# Patient Record
Sex: Female | Born: 1980 | Race: Black or African American | Hispanic: No | Marital: Married | Smoking: Never smoker
Health system: Southern US, Community
[De-identification: ages and names within clinical notes are randomized; demographics above are authoritative.]

## PROBLEM LIST (undated history)

## (undated) ENCOUNTER — Ambulatory Visit: Discharge status: Absent without leave | Payer: BC Managed Care – PPO

## (undated) DIAGNOSIS — Z87440 Personal history of urinary (tract) infections: Secondary | ICD-10-CM

## (undated) DIAGNOSIS — R519 Headache, unspecified: Secondary | ICD-10-CM

## (undated) HISTORY — DX: Headache, unspecified: R51.9

## (undated) HISTORY — DX: Personal history of urinary (tract) infections: Z87.440

## (undated) HISTORY — PX: DILATION AND CURETTAGE, DIAGNOSTIC / THERAPEUTIC: SUR384

## (undated) HISTORY — PX: TUBAL LIGATION: SHX77

---

## 2001-02-26 ENCOUNTER — Encounter: Payer: Self-pay | Admitting: Obstetrics and Gynecology

## 2001-02-26 ENCOUNTER — Ambulatory Visit (HOSPITAL_COMMUNITY): Admission: RE | Admit: 2001-02-26 | Discharge: 2001-02-26 | Payer: Self-pay | Admitting: Obstetrics and Gynecology

## 2001-06-07 ENCOUNTER — Encounter: Payer: Self-pay | Admitting: Obstetrics and Gynecology

## 2001-06-07 ENCOUNTER — Ambulatory Visit (HOSPITAL_COMMUNITY): Admission: RE | Admit: 2001-06-07 | Discharge: 2001-06-07 | Payer: Self-pay | Admitting: Obstetrics and Gynecology

## 2001-07-12 ENCOUNTER — Inpatient Hospital Stay (HOSPITAL_COMMUNITY): Admission: AD | Admit: 2001-07-12 | Discharge: 2001-07-16 | Payer: Self-pay | Admitting: Obstetrics and Gynecology

## 2002-04-25 ENCOUNTER — Other Ambulatory Visit: Admission: RE | Admit: 2002-04-25 | Discharge: 2002-04-25 | Payer: Self-pay | Admitting: Family Medicine

## 2003-08-15 ENCOUNTER — Other Ambulatory Visit: Admission: RE | Admit: 2003-08-15 | Discharge: 2003-08-15 | Payer: Self-pay | Admitting: Obstetrics and Gynecology

## 2004-01-03 ENCOUNTER — Inpatient Hospital Stay (HOSPITAL_COMMUNITY): Admission: AD | Admit: 2004-01-03 | Discharge: 2004-01-03 | Payer: Self-pay | Admitting: Obstetrics and Gynecology

## 2004-02-07 ENCOUNTER — Inpatient Hospital Stay (HOSPITAL_COMMUNITY): Admission: RE | Admit: 2004-02-07 | Discharge: 2004-02-11 | Payer: Self-pay | Admitting: Obstetrics and Gynecology

## 2004-03-19 ENCOUNTER — Other Ambulatory Visit: Admission: RE | Admit: 2004-03-19 | Discharge: 2004-03-19 | Payer: Self-pay | Admitting: Obstetrics and Gynecology

## 2004-10-25 ENCOUNTER — Ambulatory Visit: Payer: Self-pay | Admitting: Family Medicine

## 2005-05-12 ENCOUNTER — Ambulatory Visit: Payer: Self-pay | Admitting: Family Medicine

## 2005-05-15 ENCOUNTER — Other Ambulatory Visit: Admission: RE | Admit: 2005-05-15 | Discharge: 2005-05-15 | Payer: Self-pay | Admitting: Family Medicine

## 2005-05-15 ENCOUNTER — Ambulatory Visit: Payer: Self-pay | Admitting: Family Medicine

## 2005-06-06 ENCOUNTER — Ambulatory Visit: Payer: Self-pay | Admitting: Family Medicine

## 2005-08-15 ENCOUNTER — Ambulatory Visit: Payer: Self-pay | Admitting: Family Medicine

## 2005-08-27 ENCOUNTER — Ambulatory Visit: Payer: Self-pay | Admitting: Family Medicine

## 2005-09-30 ENCOUNTER — Ambulatory Visit: Payer: Self-pay | Admitting: Family Medicine

## 2006-01-27 ENCOUNTER — Ambulatory Visit: Payer: Self-pay | Admitting: Family Medicine

## 2006-05-18 ENCOUNTER — Encounter: Payer: Self-pay | Admitting: Family Medicine

## 2006-05-18 ENCOUNTER — Ambulatory Visit: Payer: Self-pay | Admitting: Family Medicine

## 2006-05-18 ENCOUNTER — Other Ambulatory Visit: Admission: RE | Admit: 2006-05-18 | Discharge: 2006-05-18 | Payer: Self-pay | Admitting: Family Medicine

## 2007-02-09 ENCOUNTER — Ambulatory Visit: Payer: Self-pay | Admitting: Family Medicine

## 2007-07-05 ENCOUNTER — Ambulatory Visit: Payer: Self-pay | Admitting: Family Medicine

## 2007-07-05 DIAGNOSIS — R079 Chest pain, unspecified: Secondary | ICD-10-CM | POA: Insufficient documentation

## 2007-07-05 DIAGNOSIS — R4589 Other symptoms and signs involving emotional state: Secondary | ICD-10-CM

## 2007-07-05 DIAGNOSIS — K219 Gastro-esophageal reflux disease without esophagitis: Secondary | ICD-10-CM | POA: Insufficient documentation

## 2007-07-08 ENCOUNTER — Telehealth: Payer: Self-pay | Admitting: Family Medicine

## 2007-07-13 ENCOUNTER — Ambulatory Visit: Payer: Self-pay | Admitting: Family Medicine

## 2007-07-26 ENCOUNTER — Ambulatory Visit (HOSPITAL_COMMUNITY): Admission: RE | Admit: 2007-07-26 | Discharge: 2007-07-26 | Payer: Self-pay | Admitting: Gynecology

## 2007-08-03 ENCOUNTER — Ambulatory Visit: Payer: Self-pay | Admitting: Family Medicine

## 2007-08-03 ENCOUNTER — Encounter: Payer: Self-pay | Admitting: Obstetrics & Gynecology

## 2007-08-31 ENCOUNTER — Ambulatory Visit: Payer: Self-pay | Admitting: Family Medicine

## 2007-09-21 ENCOUNTER — Ambulatory Visit: Payer: Self-pay | Admitting: Family Medicine

## 2007-09-28 ENCOUNTER — Ambulatory Visit (HOSPITAL_COMMUNITY): Admission: RE | Admit: 2007-09-28 | Discharge: 2007-09-28 | Payer: Self-pay | Admitting: Gynecology

## 2007-09-28 ENCOUNTER — Ambulatory Visit: Payer: Self-pay | Admitting: Obstetrics & Gynecology

## 2007-10-12 ENCOUNTER — Ambulatory Visit (HOSPITAL_COMMUNITY): Admission: RE | Admit: 2007-10-12 | Discharge: 2007-10-12 | Payer: Self-pay | Admitting: Gynecology

## 2007-10-12 ENCOUNTER — Ambulatory Visit: Payer: Self-pay | Admitting: Family Medicine

## 2007-11-08 ENCOUNTER — Ambulatory Visit: Payer: Self-pay | Admitting: Gynecology

## 2007-12-01 ENCOUNTER — Ambulatory Visit: Payer: Self-pay | Admitting: Obstetrics & Gynecology

## 2007-12-21 ENCOUNTER — Ambulatory Visit: Payer: Self-pay | Admitting: Family Medicine

## 2008-01-04 ENCOUNTER — Ambulatory Visit: Payer: Self-pay | Admitting: Gynecology

## 2008-01-17 ENCOUNTER — Ambulatory Visit: Payer: Self-pay | Admitting: Gynecology

## 2008-01-31 ENCOUNTER — Ambulatory Visit: Payer: Self-pay | Admitting: Family Medicine

## 2008-02-08 ENCOUNTER — Ambulatory Visit: Payer: Self-pay | Admitting: Family Medicine

## 2008-02-15 ENCOUNTER — Ambulatory Visit: Payer: Self-pay | Admitting: Family Medicine

## 2008-02-24 ENCOUNTER — Encounter: Payer: Self-pay | Admitting: Obstetrics and Gynecology

## 2008-02-24 ENCOUNTER — Inpatient Hospital Stay (HOSPITAL_COMMUNITY): Admission: RE | Admit: 2008-02-24 | Discharge: 2008-02-27 | Payer: Self-pay | Admitting: Obstetrics and Gynecology

## 2008-02-24 ENCOUNTER — Ambulatory Visit: Payer: Self-pay | Admitting: Obstetrics and Gynecology

## 2008-04-04 ENCOUNTER — Ambulatory Visit: Payer: Self-pay | Admitting: Family Medicine

## 2009-04-02 ENCOUNTER — Ambulatory Visit: Payer: Self-pay | Admitting: Family Medicine

## 2009-04-02 ENCOUNTER — Ambulatory Visit: Payer: Self-pay | Admitting: Obstetrics & Gynecology

## 2009-04-04 ENCOUNTER — Ambulatory Visit (HOSPITAL_COMMUNITY): Admission: RE | Admit: 2009-04-04 | Discharge: 2009-04-04 | Payer: Self-pay | Admitting: Obstetrics & Gynecology

## 2009-04-11 ENCOUNTER — Ambulatory Visit: Payer: Self-pay | Admitting: Family Medicine

## 2009-04-11 ENCOUNTER — Encounter: Payer: Self-pay | Admitting: Family Medicine

## 2009-04-11 LAB — CONVERTED CEMR LAB
Basophils Relative: 0 % (ref 0–1)
Eosinophils Absolute: 0.3 10*3/uL (ref 0.0–0.7)
Eosinophils Relative: 2 % (ref 0–5)
HCT: 40 % (ref 36.0–46.0)
Lymphocytes Relative: 22 % (ref 12–46)
Lymphs Abs: 2.3 10*3/uL (ref 0.7–4.0)
MCHC: 33.5 g/dL (ref 30.0–36.0)
MCV: 79.5 fL (ref 78.0–100.0)
Monocytes Absolute: 0.4 10*3/uL (ref 0.1–1.0)
Monocytes Relative: 4 % (ref 3–12)
Neutro Abs: 7.4 10*3/uL (ref 1.7–7.7)
Neutrophils Relative %: 71 % (ref 43–77)

## 2009-04-17 HISTORY — PX: DILATION AND CURETTAGE, DIAGNOSTIC / THERAPEUTIC: SUR384

## 2009-05-02 ENCOUNTER — Ambulatory Visit (HOSPITAL_COMMUNITY): Admission: RE | Admit: 2009-05-02 | Discharge: 2009-05-02 | Payer: Self-pay | Admitting: Obstetrics and Gynecology

## 2009-05-02 ENCOUNTER — Ambulatory Visit: Payer: Self-pay | Admitting: Obstetrics and Gynecology

## 2009-05-07 ENCOUNTER — Ambulatory Visit (HOSPITAL_COMMUNITY): Admission: RE | Admit: 2009-05-07 | Discharge: 2009-05-07 | Payer: Self-pay | Admitting: Obstetrics & Gynecology

## 2009-05-07 ENCOUNTER — Ambulatory Visit: Payer: Self-pay | Admitting: Obstetrics & Gynecology

## 2009-05-07 ENCOUNTER — Encounter: Payer: Self-pay | Admitting: Obstetrics & Gynecology

## 2009-06-12 ENCOUNTER — Ambulatory Visit: Payer: Self-pay | Admitting: Nurse Practitioner

## 2009-06-12 ENCOUNTER — Encounter: Payer: Self-pay | Admitting: Family Medicine

## 2009-06-12 LAB — CONVERTED CEMR LAB
HCT: 39.2 % (ref 36.0–46.0)
Hemoglobin: 12.8 g/dL (ref 12.0–15.0)
MCHC: 32.7 g/dL (ref 30.0–36.0)
MCV: 78.7 fL (ref 78.0–100.0)
Platelets: 263 10*3/uL (ref 150–400)
RBC: 4.98 M/uL (ref 3.87–5.11)
WBC: 9.8 10*3/uL (ref 4.0–10.5)
hCG, Beta Chain, Quant, S: 47.4 milliintl units/mL

## 2009-06-15 ENCOUNTER — Ambulatory Visit (HOSPITAL_COMMUNITY): Admission: RE | Admit: 2009-06-15 | Discharge: 2009-06-15 | Payer: Self-pay | Admitting: Obstetrics & Gynecology

## 2009-06-17 HISTORY — PX: DILATION AND CURETTAGE, DIAGNOSTIC / THERAPEUTIC: SUR384

## 2009-06-18 ENCOUNTER — Ambulatory Visit: Payer: Self-pay | Admitting: Obstetrics & Gynecology

## 2009-06-18 ENCOUNTER — Encounter: Payer: Self-pay | Admitting: Obstetrics & Gynecology

## 2009-06-18 ENCOUNTER — Ambulatory Visit (HOSPITAL_COMMUNITY): Admission: RE | Admit: 2009-06-18 | Discharge: 2009-06-18 | Payer: Self-pay | Admitting: Obstetrics & Gynecology

## 2010-02-10 IMAGING — US US OB TRANSVAGINAL
1 series · 14 of 24 positions shown · non-contrast
Comparison: none

OBSTETRICAL ULTRASOUND:
 This ultrasound exam was performed in the [HOSPITAL] Ultrasound Department.  The OB US report was generated in the AS system, and faxed to the ordering physician.  This report is also available in [REDACTED] PACS.

[Series 1: us ob transvaginal · 0.15mm/px · 24 acquisitions, 14 frames shown]
[im 1/24]
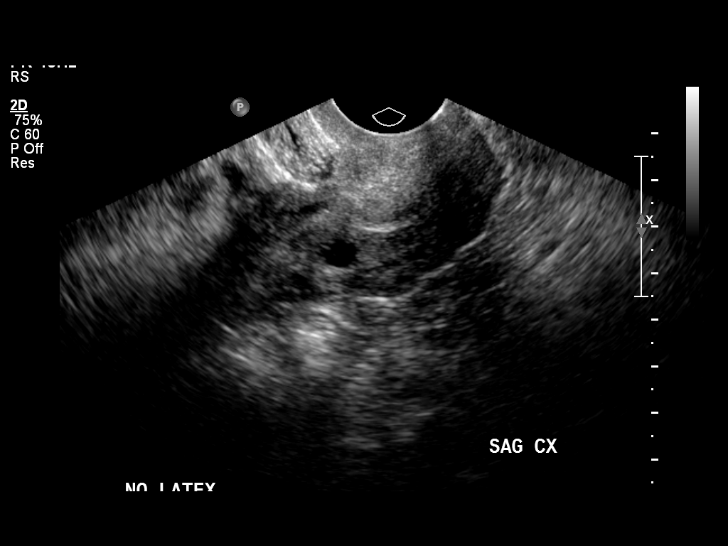
[im 3/24]
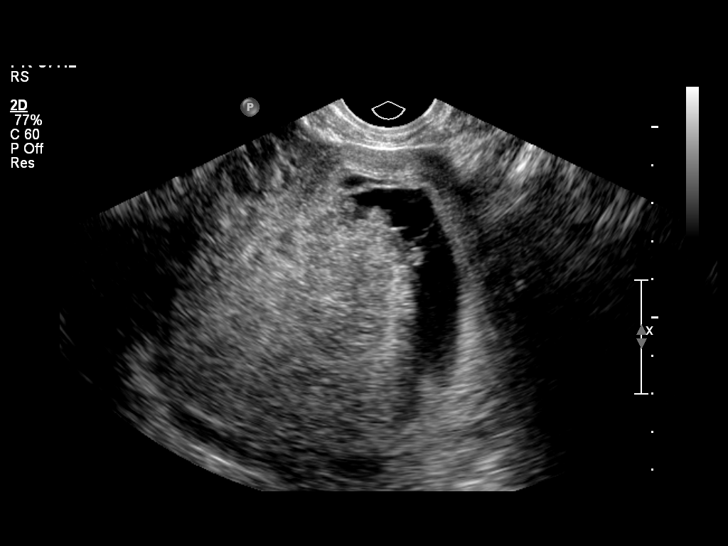
[im 5/24]
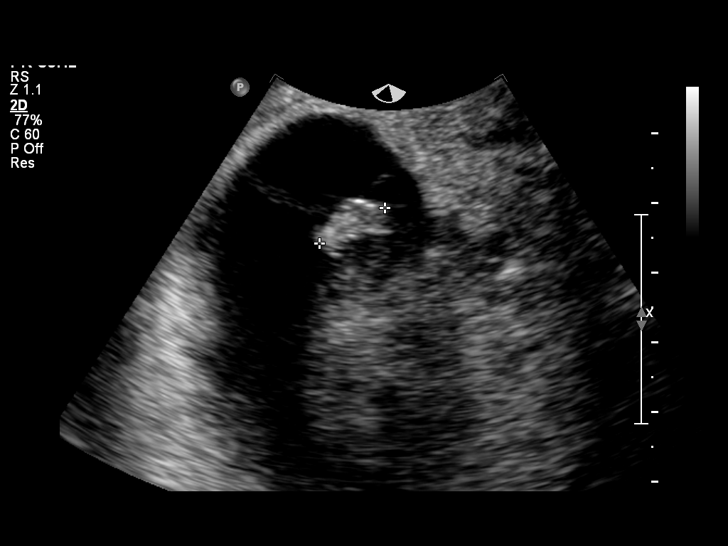
[im 7/24]
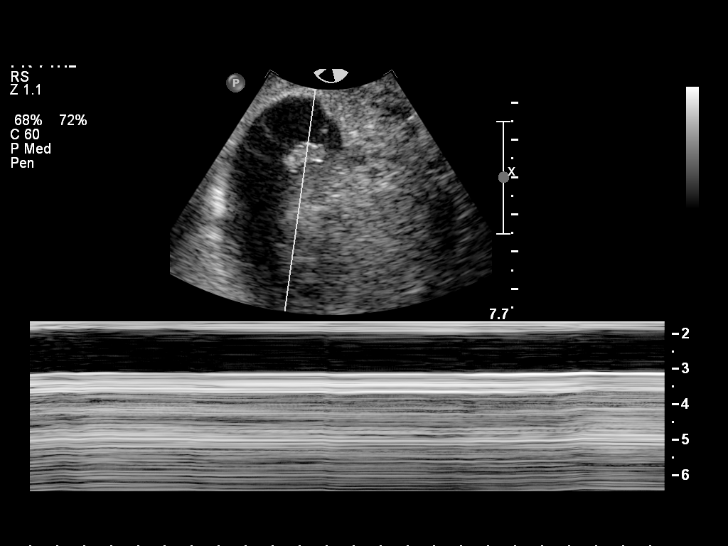
[im 8/24]
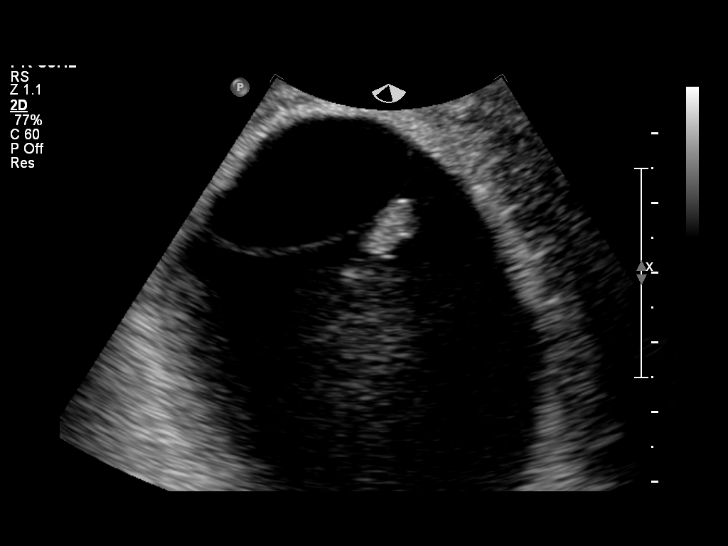
[im 10/24]
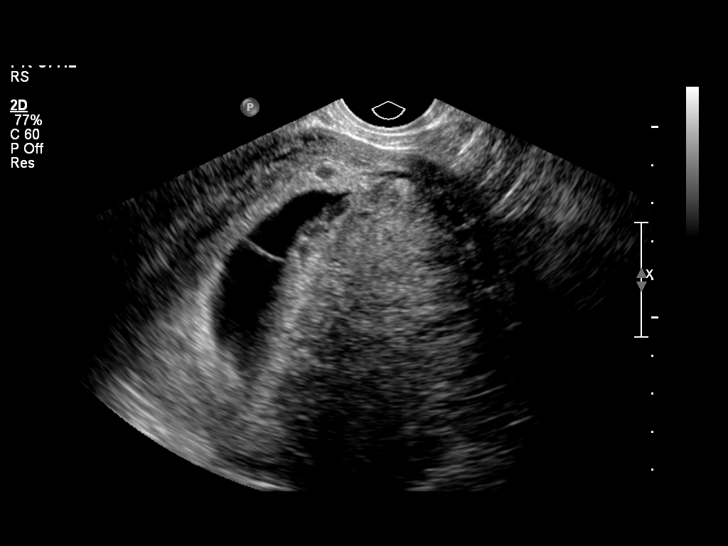
[im 12/24]
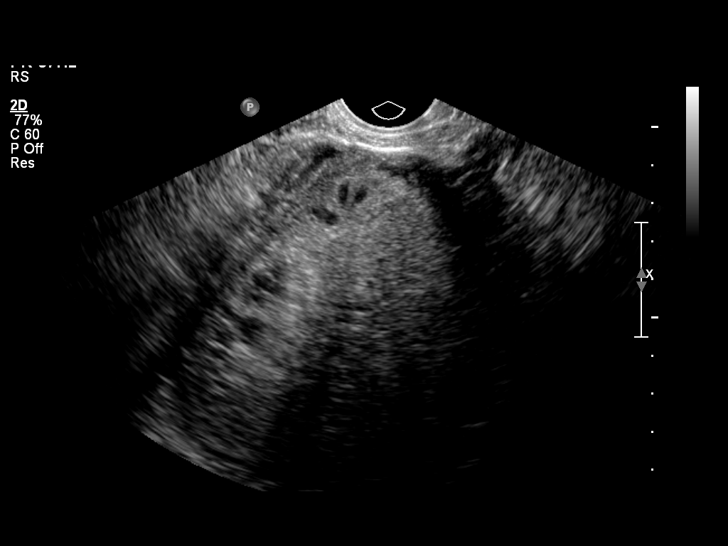
[im 13/24]
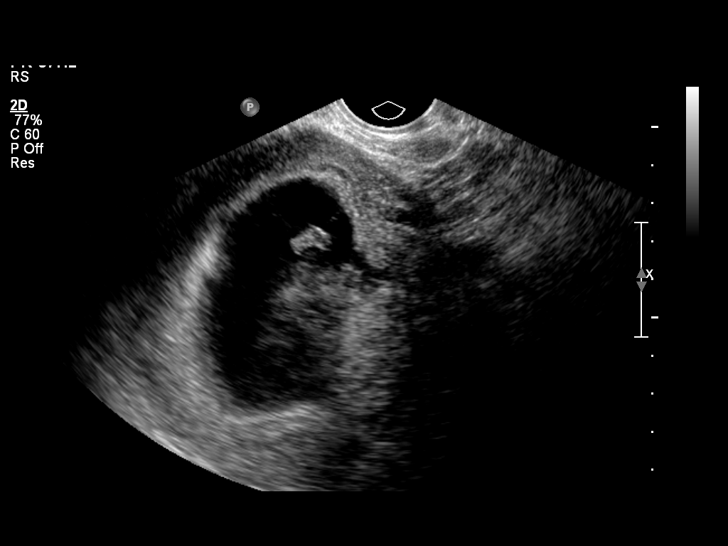
[im 15/24]
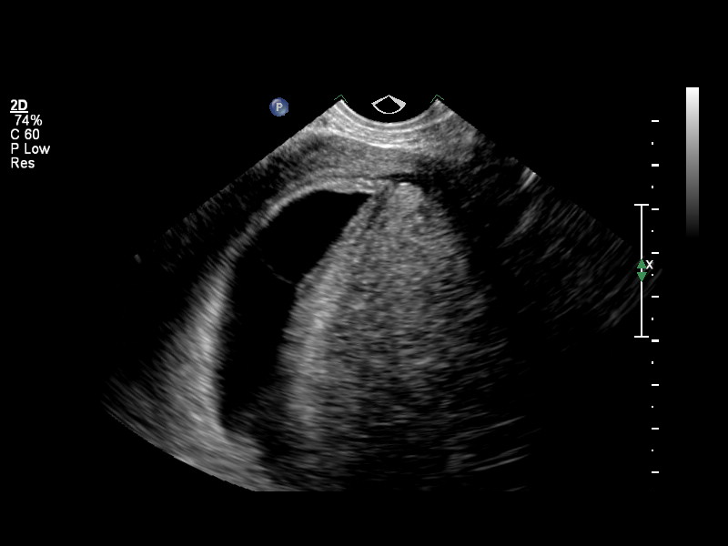
[im 17/24]
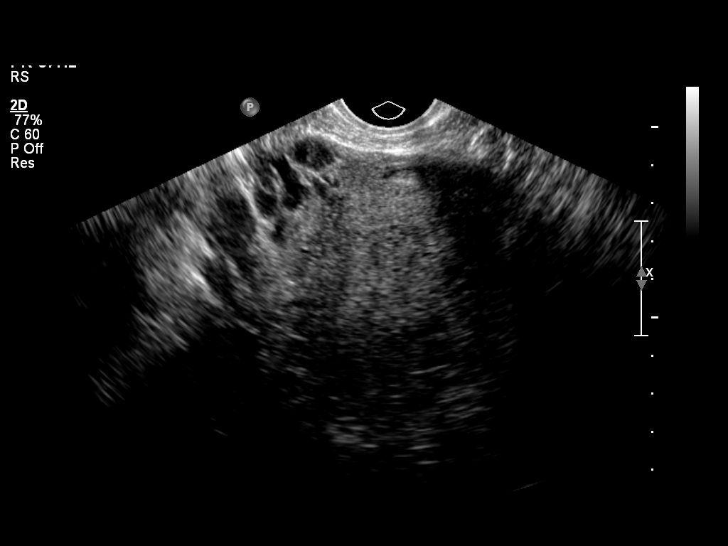
[im 19/24]
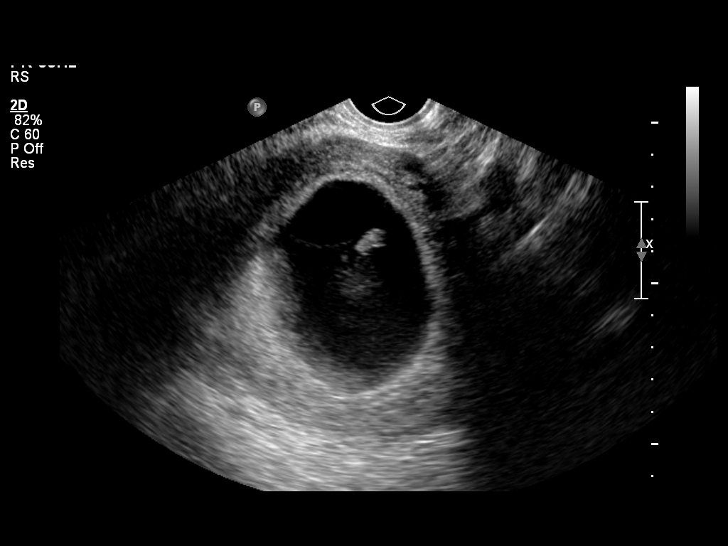
[im 20/24]
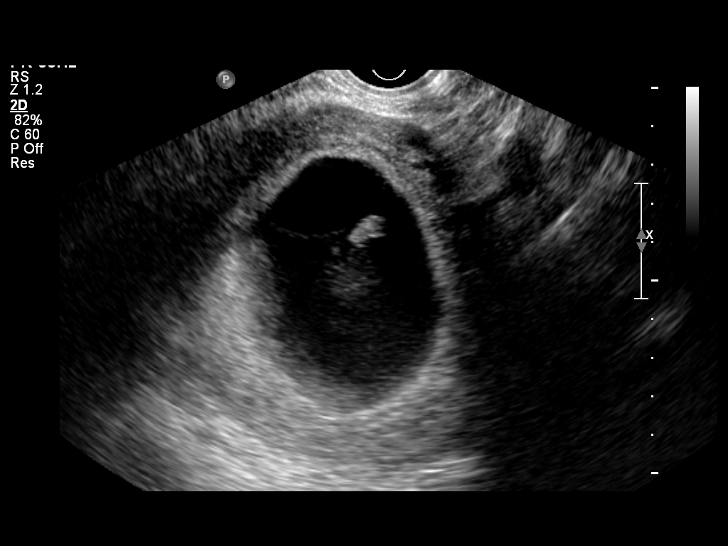
[im 22/24]
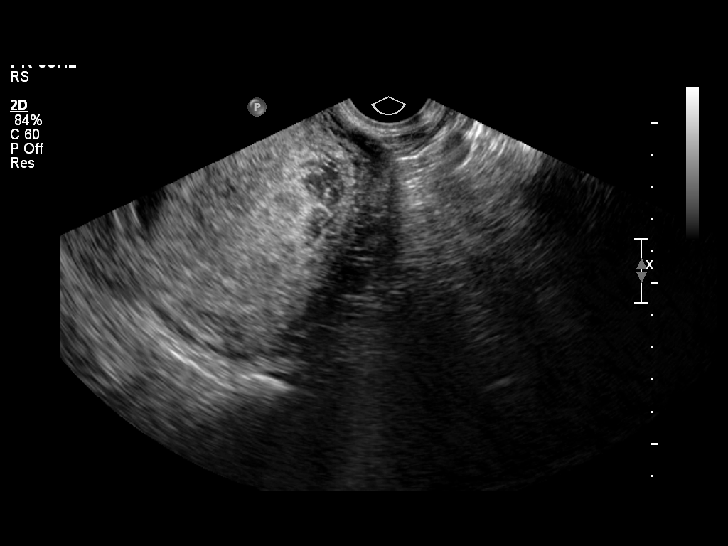
[im 24/24]
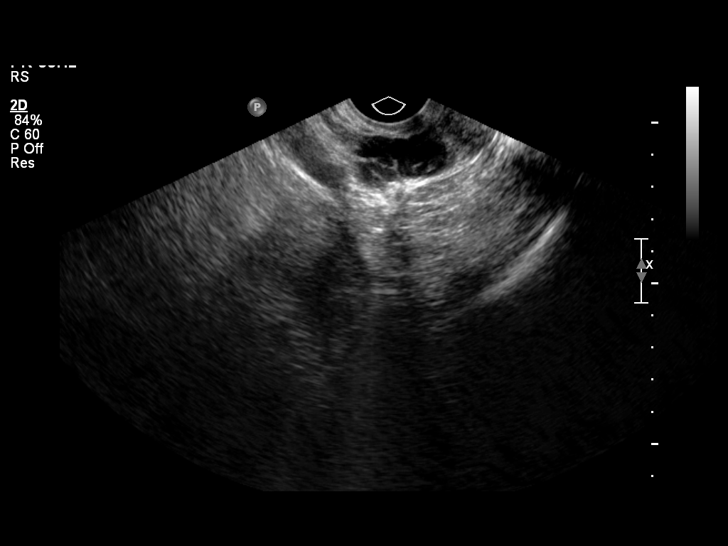

[14 of 24 positions shown; findings below may reference images not displayed]

IMPRESSION: See AS Obstetric US report.

## 2010-03-26 IMAGING — US US TRANSVAGINAL NON-OB
1 series · 13 of 25 positions shown · non-contrast
Comparison: 05/02/2009 and 04/04/2009

CLINICAL DATA: Status post D&E on 05/07/2009 for missed AB of 11-
week-4-day fetus.  Bleeding since that time.  Evaluate for retained
products

TRANSABDOMINAL AND TRANSVAGINAL ULTRASOUND OF PELVIS
TECHNIQUE: Both transabdominal and transvaginal ultrasound
examinations of the pelvis were performed including evaluation of
the uterus, ovaries, adnexal regions, and pelvic cul-de-sac.

[Series 1: us transvaginal non-ob · 0.21mm/px · 13 of 56 slices shown]
[im 1/56]
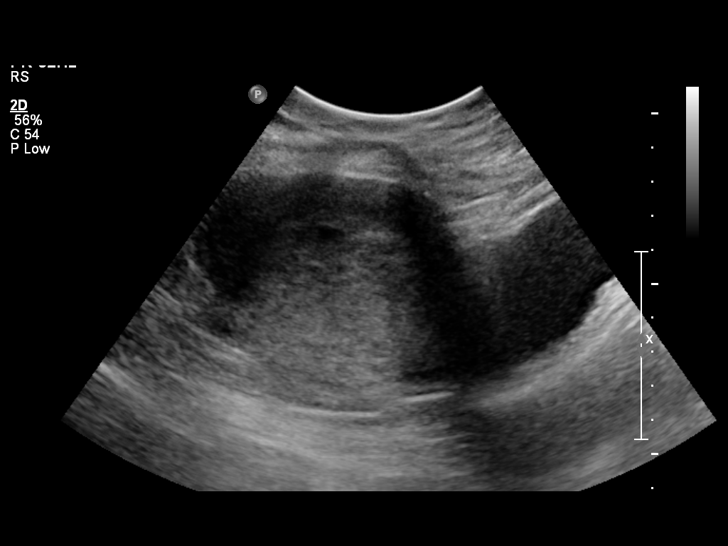
[im 5/56]
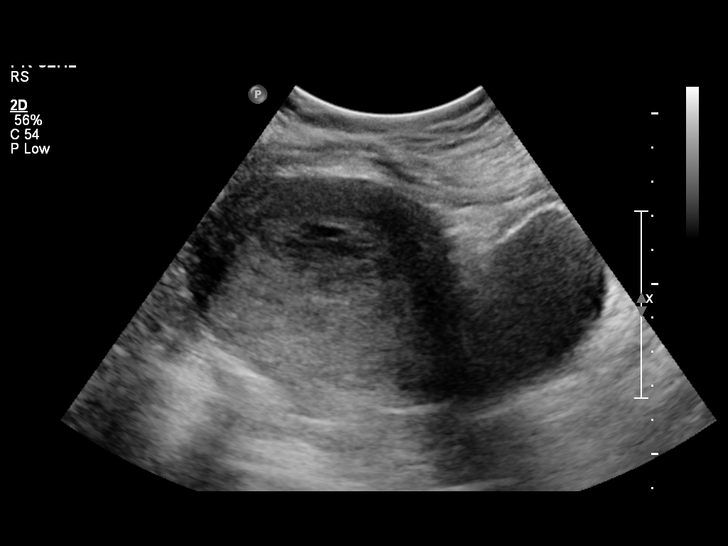
[im 10/56]
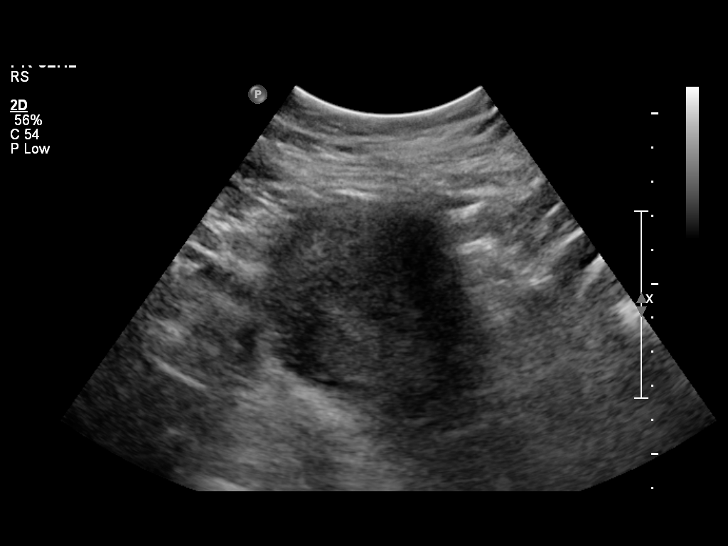
[im 14/56]
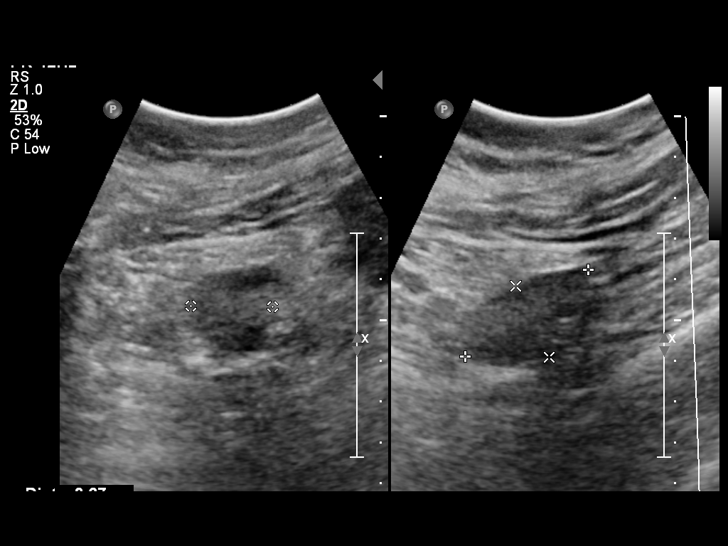
[im 19/56]
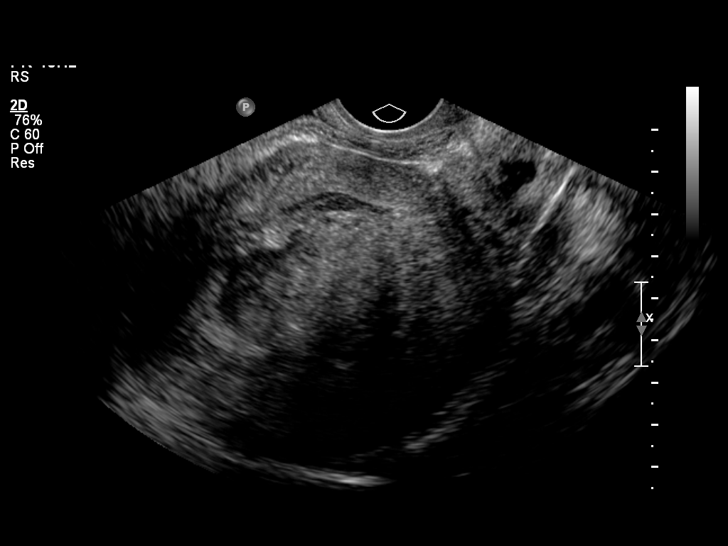
[im 23/56]
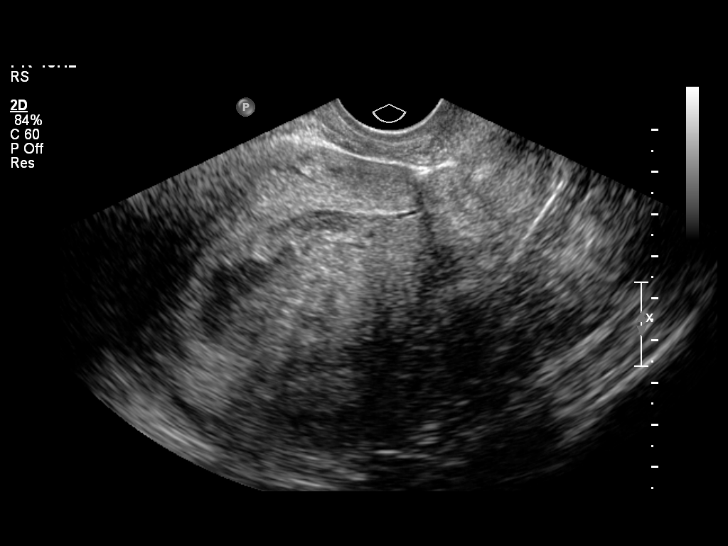
[im 28/56]
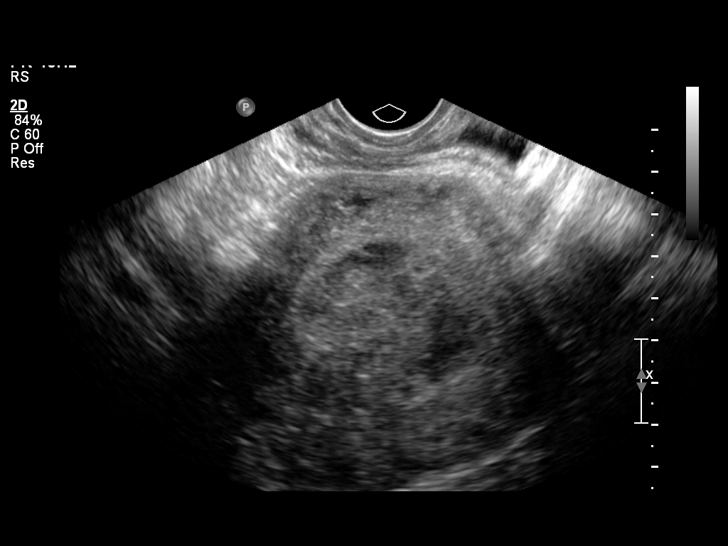
[im 33/56]
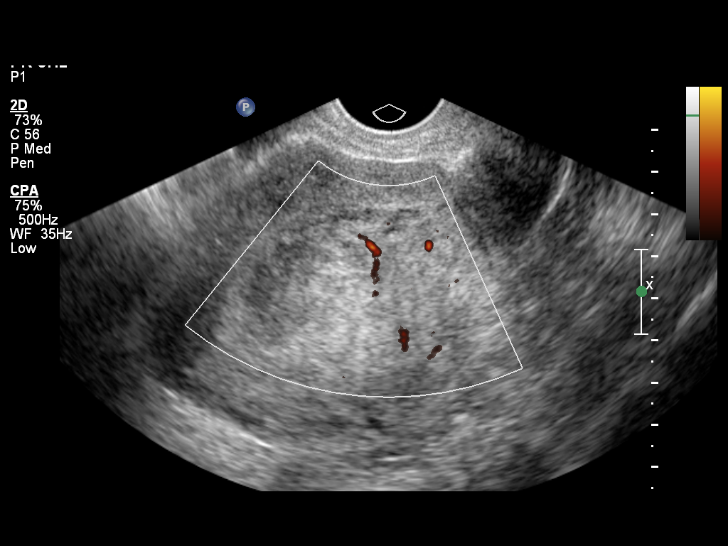
[im 37/56]
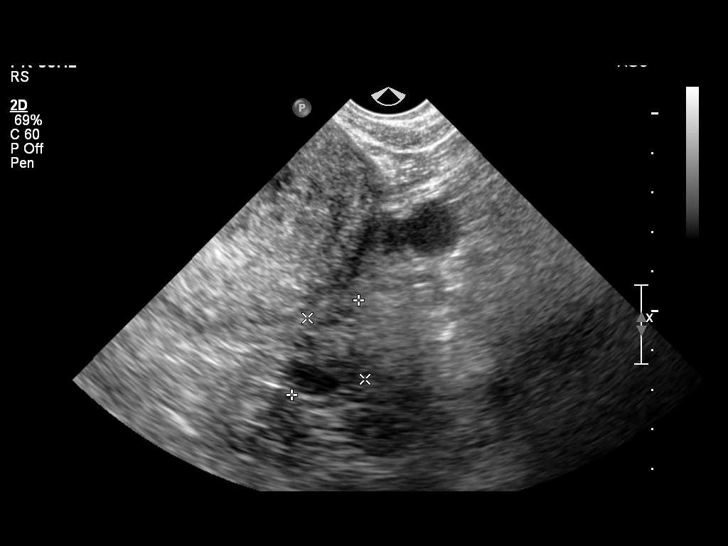
[im 42/56]
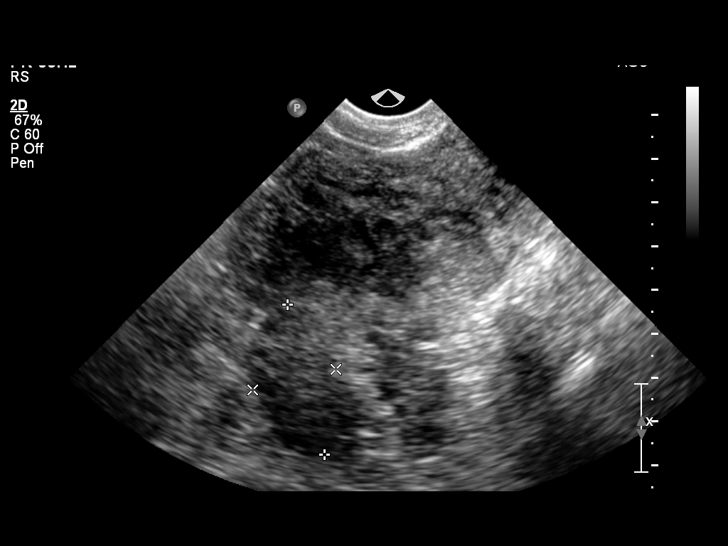
[im 46/56]
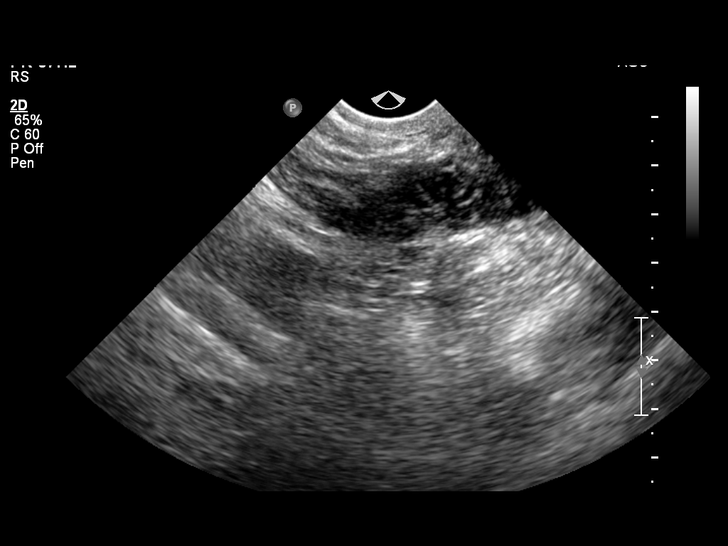
[im 51/56]
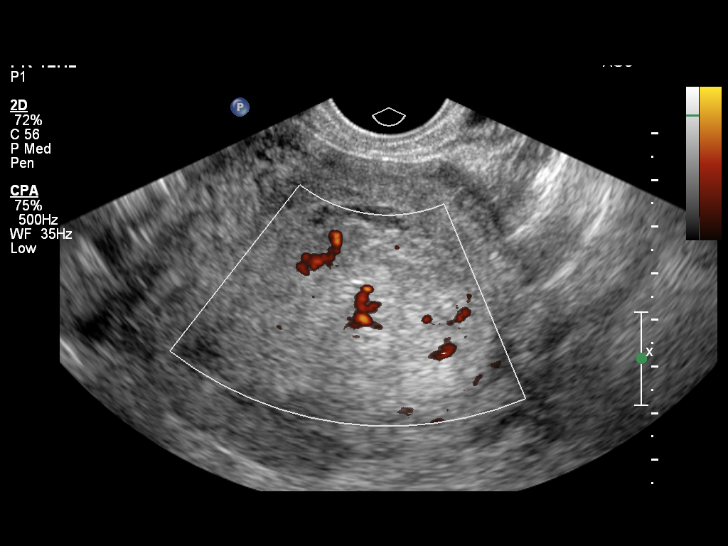
[im 56/56]
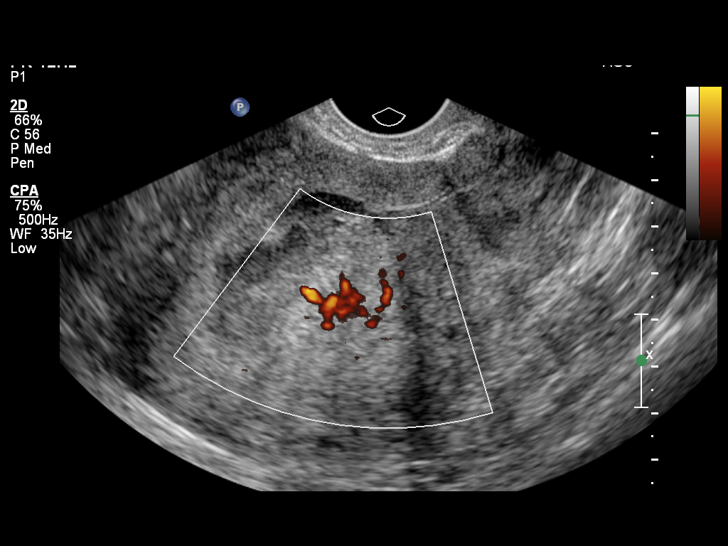

[13 of 25 positions shown; findings below may reference images not displayed]

FINDINGS: Uterus  measures 12 cm sagittally, 6.4 cm in depth and 7 cm in
width. The posterior myometrial endometrial interface is poorly
delineated.  The anterior myometrium is homogeneous.

Endometrium There is mixed echogenicity material identified within
the endometrium canal.  A portion of this appears to represent
blood products and I suspect represents intraluminal clot.  Color
flow is noted within this area of inhomogeneity and would be
suspicious for the presence of retained products of conception.  A
similar poor delineation between the suspected placental
implantation site and posterior portion of the myometrium was seen
on the prior obstetrical ultrasound demonstrating the intrauterine
demise, but this portion of the myometrium appears normal on prior
exam of 04/04/2009.  The prominent nature of the area of
inhomogeneity may be the result of a large intraluminal clot
however because of the poor myometrial endometrial interface, the
possibility of a partial mole would be a consideration and tissue
sampling post D&C would be recommended.

Right Ovary measures 3.7 x 1.9 x 2.0 cm and appears normal.

Left Ovary measures 2.2 x 2.1 x 1.5 cm and appears normal.

Other Findings:  No pelvic fluid or separate adnexal masses are
seen.
IMPRESSION: Inhomogeneous material demonstrating color flow within the
endometrial canal associated with poor posterior endometrial
myometrial interface.  The findings are suspicious for retained
products of conception and the prominent area involved would raise
the possibility of a partial molar gestation if an intraluminal
clot is not present as part of the retained material. Sampling of
the acquired tissue may be useful.

  This report was called to Dr. Guard.

## 2011-02-22 LAB — CBC
Hemoglobin: 13.1 g/dL (ref 12.0–15.0)
MCHC: 33.4 g/dL (ref 30.0–36.0)
MCV: 80.2 fL (ref 78.0–100.0)
RBC: 4.88 MIL/uL (ref 3.87–5.11)

## 2011-02-22 LAB — TYPE AND SCREEN: ABO/RH(D): A POS

## 2011-02-24 LAB — CBC
HCT: 38.2 % (ref 36.0–46.0)
MCHC: 34.4 g/dL (ref 30.0–36.0)
Platelets: 232 10*3/uL (ref 150–400)
RBC: 4.63 MIL/uL (ref 3.87–5.11)
RDW: 14.9 % (ref 11.5–15.5)
WBC: 8.1 10*3/uL (ref 4.0–10.5)

## 2011-02-24 LAB — ABO/RH: ABO/RH(D): A POS

## 2011-04-01 NOTE — Op Note (Signed)
NAMEMELBA, ARAKI NO.:  [id number]   MEDICAL RECORD NO.:  [id number]          PATIENT TYPE:  INP   LOCATION:  9199                          FACILITY:  [facility]   PHYSICIAN:  Phil D. Okey Dupre, M.D.     DATE OF BIRTH:  December 22, 1980   DATE OF PROCEDURE:  02/24/2008  DATE OF DISCHARGE:                               OPERATIVE REPORT   PREOPERATIVE DIAGNOSES:  1. Intrauterine pregnancy at 39 weeks 0 days gestation.  2. History of previous cesarean section x2.  3. Desires sterilization.   POSTOPERATIVE DIAGNOSES:  1. Intrauterine pregnancy at 39 weeks 0 days gestation.  2. History of previous cesarean section x2.  3. Desires sterilization.  4. Meconium-stained amniotic fluid.   PROCEDURE:  1. Repeat low transverse cesarean section.  2. Bilateral tubal occlusion with Filshie clips.   SURGEON:  Argentina Donovan, M.D.   ASSISTANT:  Karlton Lemon, M.D.   ANESTHESIA:  Spinal.   FINDINGS:  1. Viable infant female weighing 9 pounds 1 ounce with Apgar's of 8 at 1      minute and 9 at 5 minutes in vertex presentation.  2. Meconium-stained amniotic fluid.  3. Normal female pelvic anatomy.   ESTIMATED BLOOD LOSS:  700 mL.   DRAINS:  Foley with clear yellow urine.   COMPLICATIONS:  None immediate.   SPECIMENS:  1. Placenta to pathology.  2. Cord blood to be collected by cord bank.   INDICATIONS FOR PROCEDURE:  Ms. Folz is a 30 year old gravida 4 para 2-  0-1-2 presenting at [redacted] weeks gestation for repeat low transverse  cesarean section and bilateral tubal occlusion.  The patient has history  of two previous C-sections and repeat cesarean section was indicated.  She also desires sterilization and has been counseled about the  permanent nature of procedure.  She wishes to proceed with permanent  sterilization.   DESCRIPTION OF PROCEDURE:  The patient was taken to the operating room  and after obtaining adequate spinal anesthesia was prepped and draped in  the usual  sterile manner in the supine position with left lateral  uterine displacement.  After ensuring adequate anesthesia, a  Pfannenstiel skin incision was made using the scalpel and carried down  through the subcutaneous tissues.  The rectus fascia was nicked in the  midline and the incision was extended laterally in each direction using  the Mayo scissors.  The rectus muscle was dissected free of the fascia  using both sharp and blunt dissection.  The rectus muscles were  separated bluntly and sharply.  Parietal peritoneum was identified,  grasped between hemostats, elevated and entered inferiorly under direct  visualization.  An Alexus wound retractor was placed at this time.  A  reflection of the visceral peritoneum superior to the bladder was  identified, incised with Metzenbaum scissors and extended laterally in  each direction.  Bladder flap was then created bluntly and retracted  with the bladder blade.  A low transverse uterine incision was made  using the scalpel and incision was extended laterally and superiorly  using blunt dissection.  The amniotic membranes were ruptured  with an  Allis clamp and meconium stained amniotic fluid was noted.  Hand was  placed within the uterine cavity and used to elevate and flex the head  of the infant.  Some difficulty was met trying to deliver the head and a  vacuum was placed.  Head was delivered with one continuous pull of the  vacuum easily.  The infant was then bulb suctioned after delivery of the  head.  Nuchal cord x1 was reduced after delivery of the head.  The  infant was then delivered without difficulty.  Cord was doubly clamped  and cut and the infant was handed to the nursery team in attendance.  The placenta was then delivered with cord traction manually and appeared  intact.  Cord blood was to be collected by the cord bank staff.  The  endometrial cavity was then wiped free of any trace of membranes using  wet laparotomy sponges.  The  edges of the uterine incision were grasped  with ring clamps and the uterine incision was closed in one layer of  running locking 0 Vicryl.  One area of bleeding along the uterine  incision required a figure-of-eight stitch prior to good hemostasis.  Attention was then turned to the patient's right tube where Babcock  clamps were placed x2.  The tube was identified out to the fimbria and a  Filshie clamp was placed between the two Babcock clamps at the isthmus  ampullary junction.  The Babcock clamps were released and the tube was  allowed to fall back within the peritoneal cavity.  Attention was then  turned left tube which was once again clamped with Babcock clamps.  The  fimbria was identified and a Filshie clip was placed between the Babcock  clamps at the isthmus ampullary junction.  Babcock clamps were then  released and the tube was allowed to fall back within the peritoneal  cavity.  The peritoneal cavity was then irrigated with copious amounts  of normal saline.  The uterine incision was inspected once more and  found to have good hemostasis.  The rectus muscles were inspected and  found to have good hemostasis.  The rectus fascia was then  reapproximated one suture of 0 Vicryl in a running unlocked fashion.  Small areas of bleeding in the subcutaneous tissues were controlled  using Bovie cautery.  The skin was reapproximated with stainless steel  skin staples.  Sponge, needle and instrument counts were correct x3.  The patient tolerated the procedure well and went to the postanesthesia  care unit in stable condition.      Karlton Lemon, MD  Electronically Signed     ______________________________  Javier Glazier. Okey Dupre, M.D.    NS/MEDQ  D:  02/24/2008  T:  02/24/2008  Job:  [id number]

## 2011-04-01 NOTE — Assessment & Plan Note (Signed)
NAME:  ALEAH, AHLGRIM NO.:  [id number]   MEDICAL RECORD NO.:  [id number]          PATIENT TYPE:  POB   LOCATION:  [facility] at [facility]         FACILITY:  [facility]   PHYSICIAN:  Argentina Donovan, MD        DATE OF BIRTH:  [birth year]   DATE OF SERVICE:  05/03/2009                                  CLINIC NOTE   The patient is a 30 year old gravida 5, para 3-0-1-3, came in yesterday  for her routine OB visit and was found to have no fetal heart beat noted  at 11 weeks and evidence of perhaps a missed AB.  She was sent to [facility] for ultrasound which revealed a failed intrauterine pregnancy at  7 weeks 1 day.  I have called the patient this morning, we talked about  her options, she realized that she had already had a failed pregnancy  and we talked about just waiting until she miscarried.  I told her that  was one of her options, but she may end up passing part of it, ended up  in the emergency room in the of middle night, hemorrhaging, and needed D  and C that way or we could just schedule her for a D and C some time  next week and get that whole thing take care of, or the third option  would be to give her Cytotec where she would probably pass it within the  next day or so.  She is going to discuss with her husband.  I have told  her that Cytotec she still could have an incomplete end up with a D and  C.  So she is going to make a decision on what she wants to do, let us  know and we will go ahead and treat appropriately.           ______________________________  Argentina Donovan, MD     PR/MEDQ  D:  05/03/2009  T:  05/03/2009  Job:  [id number]

## 2011-04-01 NOTE — Discharge Summary (Signed)
Ashlee Campbell, Ashlee Campbell NO.:  [id number]   MEDICAL RECORD NO.:  [id number]          PATIENT TYPE:  INP   LOCATION:                                FACILITY:  [facility]   PHYSICIAN:  Tanya S. Shawnie Pons, M.D.   DATE OF BIRTH:  11-Aug-1981   DATE OF ADMISSION:  02/24/2008  DATE OF DISCHARGE:  02/27/2008                               DISCHARGE SUMMARY   DISCHARGE DIAGNOSIS:  Repeat low-transverse C-section and bilateral  tubal ligation, viable female.  No complications.   SUMMARY OF LABORATORY DATA:  At admission, the patient had a CBC that  showed a white blood cell count of 9.7, hemoglobin 12.0, hematocrit  35.8, and platelets 253.  The patient's group type A, Rh positive, RPR  nonreactive.  Repeat CBC on April 10th showed a white blood cell count  of 11.4, hemoglobin 10.2, hematocrit 30.0, and platelets 237.   BRIEF SUMMARY OF HOSPITAL COURSE:  This is a 30 year old G4, P2-0-1-2  that had had 2 previous C-sections and was admitted for a repeat C-  section.  This patient's lab work showed GBS negative, HIV nonreactive.  Blood type A, Rh positive.  VDRL nonreactive.  GC and Chlamydia  negative.  Repeat low transverse C-section and bilateral tubal ligation  was performed on February 24, 2008, at 11:15 hours by Dr. Okey Dupre and Dr.  Darrol Angel.  The patient delivered a viable female.  No complications.  EBL of  approximately 700 mL.  The patient is breast-feeding and bottle-feeding  infant without difficulty.  Infant female was circumcised and the patient  tolerated the procedure well.  Apgars were 8 and 9.  Weight 9 pounds 1  ounce, length 20.5 cm.  The patient remained stable after procedure,  afebrile.  Pain was well controlled with p.o. pain medication and  postpartum bleeding was also stable at the time of discharge.  The  patient will have baby love nurse remove her staples on postop day 7, on  March 02, 2008, due to her obesity.  She will need to follow up at  [facility] in 6 weeks  after discharge.  The patient has been  discharged in a stable condition.  Newborn is leaving with his mother to  home.     ______________________________  Obstetrics Resident      Shelbie Proctor. Shawnie Pons, M.D.  Electronically Signed    OR/MEDQ  D:  02/27/2008  T:  02/27/2008  Job:  [id number]

## 2011-04-01 NOTE — Op Note (Signed)
Ashlee Campbell, Ashlee Campbell NO.:  [id number]   MEDICAL RECORD NO.:  [id number]          PATIENT TYPE:  AMB   LOCATION:  SDC                           FACILITY:  [facility]   PHYSICIAN:  Allie Bossier, MD        DATE OF BIRTH:  13-Jun-1981   DATE OF PROCEDURE:  DATE OF DISCHARGE:  05/07/2009                               OPERATIVE REPORT   PREOPERATIVE DIAGNOSIS:  Incomplete abortion.   POSTOPERATIVE DIAGNOSIS:  Incomplete abortion.   PROCEDURE:  Dilatation and curettage.   SURGEON:  Myra C. Marice Potter, MD   ASSISTANT:  Odie Sera, DO   ANESTHESIA:  General.   INDICATIONS FOR PROCEDURE:  Ms. [name] is a 30 year old gravida 5  now para 3-0-2-3, who on May 03, 2009, she was found to have an  incomplete AB on evaluation in the clinic.  She was thought to be  approximately [redacted] weeks pregnant by dating and ultrasound confirmed  possible 8- to 10-week intrauterine pregnancy with no cardiac activity.  At that time, it was noted that, was likely a twin pregnancy.  Of note,  the patient had a tubal ligation with her last cesarean section in April  2009.  The patient was counseled on options, no intervention versus  Cytotec versus D&C, and the patient elected to have a D&C.  She was then  counseled on risks and benefits of this procedure to include but not  limited to bleeding, infection, damage to internal organs as well as the  possible need for further surgery due to retained products.  The patient  voiced understanding of these risks and desired to proceed with the  procedure.   DESCRIPTION OF PROCEDURE:  The patient was taken to the operating room  where anesthesia was introduced.  She was then prepped and draped in the  usual sterile manner.  A time-out was conducted.  By now, exam confirmed  an approximately 10 weeks' sized uterus in an anteverted position.  The  cervix was grasped with tenaculum.  A paracervical block was conducted  using 10 mL of 1% Xylocaine without  epinephrine.  The uterus was then  sounded to approximately 10 cm.  The uterus was dilated in the usual  manner up to 9 dilator.  A suction curettage was then performed in the  usual manner.  A banjo curettage was then used to confirm complete  removal of all products within the uterus.  All 4 quadrants and fundus  of the uterus were curettaged and a rough uterine texture was noted  throughout.  Moderate amount of bleeding was noted after that and  Methergine 0.2 mg IM was given x1 dose.  The tenaculum was removed.  Bleeding was noted to be improved.  There was no bleeding from the  cervix.  The speculum was then removed and all sponge, instrument, and  needle counts were correct.   FINDINGS:  Anteverted uterus approximately 10 weeks' size.   SPECIMENS:  Products of conception.   DISPOSITION:  To pathology.   FINDINGS:  Not available at this time.   ESTIMATED BLOOD  LOSS:  Approximately 200 mL.   There were no immediate complications.  The patient was taken to the  PACU in good condition.      Odie Sera, DO  Electronically Signed     ______________________________  Allie Bossier, MD    MC/MEDQ  D:  05/07/2009  T:  05/08/2009  Job:  [id number]

## 2011-04-01 NOTE — Op Note (Signed)
[name], Ashlee Campbell NO.:  [id number]   MEDICAL RECORD NO.:  [id number]          PATIENT TYPE:  AMB   LOCATION:  SDC                           FACILITY:  [facility]   PHYSICIAN:  Allie Bossier, MD        DATE OF BIRTH:  [birth year]   DATE OF PROCEDURE:  06/18/2009  DATE OF DISCHARGE:  06/18/2009                               OPERATIVE REPORT   PREOPERATIVE DIAGNOSIS:  Retained products of conception.   POSTOPERATIVE DIAGNOSIS:  Retained products of conception.   PROCEDURE:  Suction dilation and curettage.   SURGEON:  Allie Bossier, MD   ANESTHESIA:  MAC and local (1% lidocaine - 9 mL).   COMPLICATIONS:  None.   ESTIMATED BLOOD LOSS:  Minimal.   SPECIMENS:  Uterine curettings.   DETAILED PROCEDURE AND FINDINGS:  The risks, benefits, and alternatives  of the surgery were explained, understood, and accepted.  She was given  100 mg of IV doxycycline in the preop area.  In the operating room, her  anesthesia was applied without complication.  She was placed in the  dorsal lithotomy position.  Her vagina was prepped and draped in the  usual sterile fashion.  A bimanual exam revealed a 8-10 weeks' size  uterus.  The adnexa were not palpable.  A speculum was placed.  The  anterior lip of the cervix was grasped with single-tooth tenaculum.  The  uterus sounded to 13-1/2 cm, then gently and easily dilated the cervix  with Hegar dilators to accommodate a #6 curved suction curette.  Suction  was applied.  A moderate amount of tissue was obtained.  Sharp curettage  was done in all quadrants and the fundus of the uterus.  Suction was  again applied.  No further tissue was obtained.  The tenaculum was  removed.  No bleeding was noted.  She was taken to recovery room in  stable condition.  Please note that prior to beginning the dilation, I  gave her a paracervical block with 1% lidocaine.  I used approximately 9  mL.  She was taken to recovery room in stable condition.   Instrument,  sponge, and needle counts were correct.  She tolerated the procedure  well.      Allie Bossier, MD  Electronically Signed     MCD/MEDQ  D:  06/18/2009  T:  06/19/2009  Job:  [id number]

## 2011-04-01 NOTE — Assessment & Plan Note (Signed)
NAMENATOYA, VISCOMI NO.:  [id number]   MEDICAL RECORD NO.:  [id number]          PATIENT TYPE:  POB   LOCATION:  [facility] at [facility]         FACILITY:  [facility]   PHYSICIAN:  Tinnie Gens, MD        DATE OF BIRTH:  Jul 14, 1981   DATE OF SERVICE:  04/04/2008                                  CLINIC NOTE   CHIEF COMPLAINT:  Postpartum check.   HISTORY OF PRESENT ILLNESS:  The patient is a 30 year old gravida 4,  para 3-0-1-3 who is 6 weeks status post repeat C-section x3 with BTL.  The patient reports that since discharge everything has been going well.  She is no longer nursing her child but is bottle-feeding instead.  The  child, she reports, is sleeping well.  She reports her mood is  excellent.  She has not started back to work.  She has not started  having sex again.  She stopped bleeding at approximately 3 weeks.   PHYSICAL EXAMINATION:  VITAL SIGNS:  As noted in the chart.  GENERAL APPEARANCE:  She is a well-developed, well-nourished female in  no acute distress.  ABDOMEN:  Soft, nontender, nondistended.  GU:  Normal external female genitalia.  BUS is normal.  Vagina is pink  and rugae.  Cervix is nulliparous without lesion.  Uterus is small,  anteverted.  No adnexal mass or tenderness.  Incision on her abdomen is  well-healed.   IMPRESSION:  1. Postpartum check  2. Status post bilateral tubal ligation.  3. History previous cesarean sections x3.   PLAN:  1. Routine postpartum care.  May resume exercise and sexual activity.  2. Follow-up Pap September 2009.           ______________________________  Tinnie Gens, MD     TP/MEDQ  D:  04/04/2008  T:  04/04/2008  Job:  [id number]

## 2011-04-01 NOTE — Assessment & Plan Note (Signed)
Ashlee Campbell, Ashlee Campbell NO.:  [id number]   MEDICAL RECORD NO.:  [id number]          PATIENT TYPE:  POB   LOCATION:  [facility] at [facility]         FACILITY:  [facility]   PHYSICIAN:  Jaynie Collins, MD     DATE OF BIRTH:  24-Nov-1980   DATE OF SERVICE:  06/12/2009                                  CLINIC NOTE   The patient is a 30 year old gravida 5, para 3-0-2-3, who is status post  D and C for a 10-week missed AB on May 07, 2009.  The patient had an  uncomplicated procedure.  However, since the procedure, she has  complained of having heavy bleeding like a menstrual period, needed to  change her pad 5 times a day and this has been going on for every single  day since her D and C.  The patient is concerned that the bleeding is  not tapering off and it did after her other deliveries.  She described  that she only had bleeding for a couple of weeks after her 3 deliveries  in the past.  She called the clinic with this complaint and was told to  come in for evaluation.  She notes some mild cramping, look just like a  period with the bleeding, but no other concerns.   On examination, the patient's pulse is 73, blood pressure is 118/78,  weight 228 pounds.  In general, no apparent distress.  Abdomen, soft,  nontender, nondistended.  Pelvic, normal external female genitalia,  pink, well-rugated vagina, small amount of old blood seen in vault.  No  active bleeding from cervix.  Gonorrhea and chlamydia culture probe was  obtained.  On bimanual exam, the patient has a small mobile uterus,  nontender on examination and normal adnexa bilaterally.   ASSESSMENT AND PLAN:  The patient is a 30 year old gravida 5, para 3-0-1-  3 who is here for increased bleeding after D and C for missed abortion.  Approximately 5 weeks ago, the patient was told that some patients do  have bleeding up to 6 weeks after delivery or after a termination of  pregnancy.  However, I doubt her bleeding should  have tapered off, but  we will go ahead and check a complete blood count and an hCG level and  also check a pelvic ultrasound to rule out retained products of  conception.  If all of these studies come back negative, the patient  will be reassured that her bleeding will hopefully taper off soon and if  her current symptoms continue, she was told to come back to the clinic  for further evaluation or followup the results of all of these studies.           ______________________________  Jaynie Collins, MD     UA/MEDQ  D:  06/12/2009  T:  06/13/2009  Job:  [id number]

## 2011-04-04 NOTE — Discharge Summary (Signed)
NAME:  Ashlee Campbell, Ashlee Campbell NO.:  [id number]   MEDICAL RECORD NO.:  [id number]                   PATIENT TYPE:  INP   LOCATION:  9117                                 FACILITY:  [facility]   PHYSICIAN:  Juluis Mire, M.D.                DATE OF BIRTH:  04-29-1981   DATE OF ADMISSION:  02/07/2004  DATE OF DISCHARGE:  02/11/2004                                 DISCHARGE SUMMARY   ADMITTING DIAGNOSES:  1. Intrauterine pregnancy at 38-and-a-half weeks estimated gestational age.  2. Previous cesarean, desires repeat.   DISCHARGE DIAGNOSES:  1. Status post low transverse cesarean section.  2. Viable female infant.   PROCEDURE:  Repeat low transverse cesarean section.   REASON FOR ADMISSION:  Please see written H&P.   HOSPITAL COURSE:  The patient was a 30 year old gravida 2 para 1 that was  admitted to [facility] for a scheduled repeat cesarean  section.  The patient had a previous cesarean delivery with the delivery of  a baby weighing 9 pounds 5 ounces.  The patient elected for a scheduled  repeat cesarean delivery.  On the morning of admission the patient was taken  to the operating room where spinal anesthesia was administered without  difficulty.  A low transverse incision was made with the delivery of a  viable female infant weighing 7 pounds 5 ounces, Apgars of 8 at one minute and  9 at five minutes.  The patient tolerated the procedure well and was taken  to the recovery room in stable condition.  On postoperative day #1 vital  signs were stable; she was afebrile.  Abdomen was soft with good return of  bowel function.  Fundus was firm and nontender.  Abdominal dressing was  noted with bright red bleeding on bandage.  The bandage was partially  removed revealing active bleeding noted from the left margin of the  incision.  Reinforcement was applied.  Labs revealed hemoglobin of 9.9;  platelet count of 324,000; wbc count of 13.2. On  postoperative day #2 vital  signs were stable; she was afebrile.  Abdomen was soft, fundus was firm and  nontender.  Abdominal dressing had been removed, now revealing an incision  that was clean, dry, and intact.  The patient was ambulating well.  She was  tolerating a regular diet without complaints of nausea and vomiting.  On  postoperative day #3 vital signs were stable.  Abdomen remained soft, fundus  was firm, incision was clean, dry, and intact.  On postoperative day #4  vital signs were stable, abdomen was soft, fundus was firm and nontender,  incision was clean, dry, and intact.  Staples were removed and the patient  was discharged home.   CONDITION ON DISCHARGE:  Good.   DIET:  Regular as tolerated.   ACTIVITY:  No heavy lifting, no driving x2 weeks, no vaginal entry.   FOLLOW-UP:  The  patient is to follow up in the office in 1 week for an  incision check.  She is to call for temperature greater than 100 degrees,  persistent nausea and vomiting, heavy vaginal bleeding, and/or redness or  drainage from the incisional site.   DISCHARGE MEDICATIONS:  1. Tylox #30 one p.o. q.4-6h. p.r.n.  2. Motrin 600 mg q.6h.  3. Prenatal vitamins one p.o. daily.  4. Colace one p.o. daily p.r.n.     Julio Sicks, N.P.                        Juluis Mire, M.D.    CC/MEDQ  D:  03/06/2004  T:  03/07/2004  Job:  [id number]

## 2011-04-04 NOTE — Op Note (Signed)
NAME:  Ashlee Campbell, BEEVER NO.:  [id number]   MEDICAL RECORD NO.:  [id number]                   PATIENT TYPE:  INP   LOCATION:  9117                                 [facility]ACILITY[facility]  [facility]   PHYSICIAN:  Michelle L. Vincente Poli, M.D.            DATE OF BIRTH:  1981-05-23   DATE OF PROCEDURE:  02/07/2004  DATE OF DISCHARGE:                                 OPERATIVE REPORT   PREOPERATIVE DIAGNOSIS:  Intrauterine pregnancy at 38-1/2 weeks with  previous cesarean section.   POSTOPERATIVE DIAGNOSIS:  Intrauterine pregnancy at 38-1/2 weeks with  previous cesarean section.   PROCEDURE:  Repeat low transverse cesarean section.   SURGEON:  Michelle L. Vincente Poli, M.D.   ANESTHESIA:  Spinal.   ESTIMATED BLOOD LOSS:  500 mL.   The patient was taken to the operating room, and she was given her spinal  without incident and placed in the dorsal supine position with a leftward  tilt.  She was prepped and draped in the usual sterile fashion and a Foley  catheter was inserted into the bladder.  A low transverse incision was made  at the area of the previous surgical scar and carried down to the fascia.  The fascia was scored in the midline, extended laterally.  A Pfannenstiel  incision was developed, the rectus muscles were separated in the midline,  and the peritoneum was entered bluntly.  The peritoneal incision was then  stretched.  The bladder blade was inserted, the lower uterine segment was  identified, and the bladder flap was created sharply and then digitally.  The bladder blade was readjusted.  A low transverse incision was made in the  uterus.  There was noted to be light meconium in the fluid.  The baby was  delivered easily with the assistance of a vacuum extractor with one pull.  It was a female infant with Apgars of 8 at one minute and 9 at five minutes.  It appeared LGA by exam.  The cord was clamped and cut and the baby was  handed to the awaiting pediatricians.  The  cord blood was obtained.  The  placenta was manually removed.  The uterus was exteriorized and cleared of  all clots and debris.  The uterine incision was closed in two layers using 0  chromic in continuous running stitch.  It was hemostatic.  The uterus was  returned to the abdomen.  Irrigation was performed.  The incision was  reinspected, hemostasis was again noted.  The peritoneum was then closed  using 0 Vicryl in continuous running stitch in the rectus muscles,  reapproximating the __________ using the same 0 Vicryl.  The fascia was  closed using 0 Vicryl continuous running stitch, starting at each corner and  meeting in the middle.  After irrigation of the subcutaneous layer, the skin  was closed with staples.  All sponge, lap, and instrument counts were  correct x2.  The patient went to the recovery room in stable condition.                                               Michelle L. Vincente Poli, M.D.    Florestine Avers  D:  02/07/2004  T:  02/08/2004  Job:  [id number]

## 2011-04-04 NOTE — Discharge Summary (Signed)
[facility]  Patient:    Ashlee Campbell, Ashlee Campbell Visit Number: [id number] MRN: [id number]          Service Type: OBS Location: 910A 9131 01 Attending Physician:  Leonard Schwartz Dictated by:   Saverio Danker, C.N.M. Admit Date:  07/12/2001 Disc. Date: 07/16/01                             Discharge Summary  ADMISSION DIAGNOSES:          1. Intrauterine pregnancy at term.                               2. Early labor.                               3. Suspected macrosomia.                               4. Obesity.                               5. Sickle cell trait.  DISCHARGE DIAGNOSES:           1. Intrauterine pregnancy at term.                                2. Early labor.                                3. Suspected macrosomia.                                4. Obesity.                                5. Sickle cell trait.                                6. Thick meconium stained fluid.                                7. Failure to progress in labor.                                8. Status post cesarean section for delivery.                                9. Occult cord prolapse.                               10. Breast-feeding.                               11. Undecided regarding contraception.  PROCEDURE:  Primary low transverse cesarean section for delivery of a viable female infant named Sheria Lang who weighed 9 pounds 5 ounces and had Apgars of 9 and 9 on July 13, 2001 attended by Dr. Dierdre Forth and Saverio Danker, C.N.M.  HOSPITAL COURSE:              Ashlee Campbell is a 30 year old black female gravida 2, para 0-0-1-0 at 109 2/7 weeks who presented in early labor who progressed to approximately 6 cm requiring Pitocin to achieve that amount of cervical dilation.  She failed to progress beyond that even with 32 milliunits of Pitocin and was noted to have a fever and meconium stained fluid.  Throughout her labor  time the fetal heart rate remained reactive and reassuring and after more than two hours of trying to increase her Pitocin to reach adequacy and failing to progress beyond 6 cm for more than four hours the patient was offered cesarean section for delivery and accepted and underwent the same for delivery of a viable female infant named Sheria Lang who weighed 9 pounds 5 ounces and had Apgars of 9 and 9 on July 13, 2001 attended by Dr. Dierdre Forth and Vance Gather Duplantis, C.N.M.  Please see operative note for details. Postoperatively the patient has done well.  She has been afebrile throughout her hospital stay, subsequent to delivery.  She is ambulating, voiding, and eating without difficulty.  Her vital signs are stable.  She is breast-feeding without difficulty.  She is undecided regarding contraception.  She is deemed ready for discharge today.  DISCHARGE INSTRUCTIONS:       As per the [facility] OB[facility]GY[facility] handout.  DISCHARGE MEDICATIONS:        1. Motrin 600 mg p.o. q.6h. p.r.n. for pain.                               2. Tylox one to two p.o. q.4-6h. p.r.n. for                                  pain.                               3. Prenatal vitamins q.d.  DISCHARGE LABORATORIES:       Hemoglobin 9.7, WBC 16.3, platelets 312,000.  DISCHARGE FOLLOW-UP:          Six weeks at [facility] OB[facility]GYN or p.r.n. Dictated by:   Vance Gather Duplantis, C.N.M. Attending Physician:  Leonard Schwartz DD:  07/16/01 TD:  07/16/01 Job: [id number]

## 2011-04-04 NOTE — Op Note (Signed)
[facility]  Patient:    Ashlee Campbell, FELBER Visit Number: [id number] MRN: [id number]          Service Type: OBS Location: 910A 9131 01 Attending Physician:  Leonard Schwartz Proc. Date: 07/13/01 Adm. Date:  07/12/2001                             Operative Report  PREOPERATIVE DIAGNOSES:       1. Intrauterine pregnancy at term.                               2. Meconium-stained Amniotic fluid.                               3. Arrest of active phase of labor.  POSTOPERATIVE DIAGNOSES:      1. Intrauterine pregnancy at term.                               2. Meconium-stained amniotic fluid.                               3. Arrest of active phase of labor.                               4. Macrosomia.                               5. Occult cord prolapse.  OPERATION:                    Primary low transverse cesarean section.  SURGEON:                      Vanessa P. Pennie Rushing, M.D.  FIRST ASSISTANT:              Vance Gather Duplantis, C.N.M.  ANESTHESIA:                   Epidural.  ESTIMATED BLOOD LOSS:         1000 cc.  COMPLICATIONS:                None.  FINDINGS:                     The uterus, tubes and ovaries were normal for the gravid state.  The patient was delivered of a female infant whose name is Sheria Lang weighing 9 lb 5 oz with Apgars of 9 and 9 at one and five minutes respectively.  DESCRIPTION OF PROCEDURE:     The patient was taken to the operating room after appropriate identification and after an explanation of the risks and benefits of cesarean section.  The patient was placed on the operating table with her labor Foley catheter and labor epidural in place.  The epidural was topped for surgical anesthesia.  The abdomen was then taped to allow access to the suprapubic region and the abdomen prepped and draped as a sterile field. After assurance of adequate anesthesia, a transverse incision was made in the abdomen and the  abdomen opened in layers.  The peritoneum was entered and a bladder blade placed.  The uterus was incised approximately 1 cm above the uterovesical fold and the uterine incision extended bluntly.  The infant was delivered from the occiput transverse position and, after having the nares and pharynx suctioned and the cord clamped and cut, was handed off to the awaiting pediatricians.  DeLee suction had been performed prior to delivery of the fetal thorax.  The appropriate cord blood was drawn and the placenta noted to have separated from the uterus.  It was thus removed from the operative field. The uterine incision was closed with a running interlocking suture of 2-0 Vicryl.  An imbricating suture of 0 Vicryl was then placed.  Hemostasis was noted to be adequate.  Copious irrigation was carried out.  The abdominal peritoneum was closed with a running suture of 2-0 Vicryl.  The rectus muscles were reapproximated in the midline with a figure-of-eight suture of 2-0 Vicryl.  The rectus muscles were made hemostatic with Bovie cautery and irrigated.  The rectus fascia was closed with a running suture of 0 Vicryl then reinforced on either side of midline with figure-of-eight sutures of 0 Vicryl.  The subcutaneous tissues was irrigated and made hemostatic with Bovie cautery.  Skin staples were applied to the skin incision.  A sterile dressing was applied.  The patient was taken from the operating room to the recovery room in satisfactory condition, having tolerated the procedure well with sponge and instrument counts correct. Attending Physician:  Leonard Schwartz DD:  07/13/01 TD:  07/13/01 Job: [id number]

## 2011-04-04 NOTE — H&P (Signed)
[facility]  Patient:    Ashlee Campbell, Ashlee Campbell Visit Number: [id number] MRN: [id number]          Service Type: OBS Location: 910B 9166 01 Attending Physician:  Leonard Schwartz Dictated by:   Nigel Bridgeman, C.N.M. Adm. Date:  07/12/2001                           History and Physical  HISTORY OF PRESENT ILLNESS:   Pennix is a 30 year old, gravida 2, para 0-0-1-0, at 39-2/7 weeks, who presents with uterine contractions every two to three minutes since 4 p.m.  She denies any leaking or bleeding and reports fetal movement.  The pregnancy as been remarkable for: 1. Questionable last menstrual period.  2. Increased body habitus.  PRENATAL LABORATORY DATA:     Blood type is A+.  Rh antibody negative.  VDRL nonreactive.  Rubella titer was immune.  Sickle cell trait was positive.  RPR was nonreactive.  Hepatitis B surface antigen was negative.  Pap was normal. GC and chlamydia cultures were negative.  Glucose challenge was normal.  The hemoglobin upon entry into practice was 13.2.  It was 11.2 at 25 weeks.  Group B streptococcus culture was negative at 36 weeks.  An EDC of July 17, 2001, was established by a 9-week ultrasound and was in agreement with ultrasound at approximately 18 weeks.  HISTORY OF PRESENT PREGNANCY:                    The patient entered care at approximately 10 weeks.  She had an ultrasound at approximately 9 weeks showing viability. She had an ultrasound at 21 weeks that showed normal growth and fluid.  She had some cramping at approximately 30 weeks.  The cervix was closed and long. She did continue to measure size greater than dates, but had an ultrasound at 34 weeks that showed growth at approximately the 72nd percentile.  The rest of her pregnancy was essentially uncomplicated.  OBSTETRICAL HISTORY:          In June of 2001, she had a therapeutic termination of pregnancy without complications.  She is a previous  condom user.  PAST MEDICAL HISTORY:         She had the usual childhood illnesses.  She has sickle cell trait.  ALLERGIES:                    She has an allergy to PENICILLIN which causes an unknown reaction.  FAMILY HISTORY:               She has a cousin who had postpartum hypertension.  Her maternal grandfather had hypertension.  Her cousins have asthma.  Her maternal grandmother has COPD and requires oxygen.  Her maternal aunt is on anxiety medications.  GENETIC HISTORY:              Remarkable for her brother and father having extra digits.  Her mother also has sickle cell trait.  She has a third cousin with cerebral palsy.  SOCIAL HISTORY:               The patient is married to the father of the baby.  He is involved and supportive.  His name is Baldwin Crown, Montez Hageman.  The patient has a high school education.  She is employed in an Scientist, clinical (histocompatibility and immunogenetics).  Her husband has one year of technical school and he  is employed in a Event organiser.  She is African-American and of the [place] faith.  She has been followed by the [facility] [place].  She denies any alcohol, drug, or tobacco use during this pregnancy.  PHYSICAL EXAMINATION:         Vital signs are stable.  The patient is afebrile.  HEENT:                        Within normal limits.  LUNGS:                        Bilateral breath sounds are clear.  HEART:                        Regular rate and rhythm without murmur.  BREASTS:                      Soft and nontender.  ABDOMEN:                      The fundal height is approximately 42 cm.  The estimated fetal weight is 8-9 pounds.  Uterine contractions are every 2-3 minutes, moderate to strong quality.  PELVIC:                       The cervical exam is 3 cm, 80%, vertex -1 station with the vertex verified by bedside ultrasound.  Membranes are also bulging.  There is a small amount of bloody show.  The fetal heart  rate is reassuring with a negative spontaneous CST.  EXTREMITIES:                  The deep tendon reflexes are 2+ without clonus. There is trace edema noted.  IMPRESSION:                   1. Intrauterine pregnancy at 39-2/7 weeks.                               2. Increased body habitus.  PLAN:                         1. Admit to birthing suite per consult with                                  Janine Limbo, M.D., as attending                                  physician.                               2. Routine certified nurse midwife orders.                               3. The patient request epidural as labor  progresses.                               4. Will given Stadol and Phenergan at this                                  point. Dictated by:   Nigel Bridgeman, C.N.M. Attending Physician:  Leonard Schwartz DD:  07/12/01 TD:  07/12/01 Job: [id number]

## 2011-08-12 LAB — CBC
HCT: 35.8 — ABNORMAL LOW
Hemoglobin: 10.2 — ABNORMAL LOW
MCHC: 33.6
Platelets: 237
RBC: 4.04
RBC: 4.79
RDW: 17.5 — ABNORMAL HIGH
RDW: 18.1 — ABNORMAL HIGH
WBC: 11.4 — ABNORMAL HIGH

## 2011-08-12 LAB — ABO/RH: ABO/RH(D): A POS

## 2011-08-12 LAB — CROSSMATCH: ABO/RH(D): A POS

## 2013-08-03 ENCOUNTER — Ambulatory Visit: Payer: Self-pay | Admitting: Family Medicine

## 2015-03-05 ENCOUNTER — Ambulatory Visit: Payer: Self-pay | Admitting: Internal Medicine

## 2015-03-06 ENCOUNTER — Telehealth: Payer: Self-pay | Admitting: Internal Medicine

## 2015-03-06 ENCOUNTER — Ambulatory Visit: Payer: Self-pay | Admitting: Internal Medicine

## 2015-03-06 DIAGNOSIS — Z0289 Encounter for other administrative examinations: Secondary | ICD-10-CM

## 2015-03-06 NOTE — Telephone Encounter (Signed)
Patient did not come in for their appointment today for new patient to resablish.  Please let me know if patient needs to be contacted immediately for follow up or no follow up needed.

## 2015-03-06 NOTE — Telephone Encounter (Signed)
No follow up needed

## 2019-11-23 ENCOUNTER — Encounter: Payer: Self-pay | Admitting: Advanced Practice Midwife

## 2019-11-30 ENCOUNTER — Other Ambulatory Visit: Payer: Self-pay | Admitting: Family Medicine

## 2019-11-30 ENCOUNTER — Ambulatory Visit (INDEPENDENT_AMBULATORY_CARE_PROVIDER_SITE_OTHER): Payer: BC Managed Care – PPO | Admitting: Family Medicine

## 2019-11-30 ENCOUNTER — Encounter: Payer: Self-pay | Admitting: Radiology

## 2019-11-30 ENCOUNTER — Encounter: Payer: Self-pay | Admitting: Family Medicine

## 2019-11-30 ENCOUNTER — Other Ambulatory Visit: Payer: Self-pay

## 2019-11-30 VITALS — BP 134/89 | HR 90 | Ht 61.0 in | Wt 235.0 lb

## 2019-11-30 DIAGNOSIS — Z9851 Tubal ligation status: Secondary | ICD-10-CM | POA: Diagnosis not present

## 2019-11-30 DIAGNOSIS — Z23 Encounter for immunization: Secondary | ICD-10-CM

## 2019-11-30 DIAGNOSIS — Z01419 Encounter for gynecological examination (general) (routine) without abnormal findings: Secondary | ICD-10-CM | POA: Diagnosis not present

## 2019-11-30 NOTE — Progress Notes (Signed)
   GYNECOLOGY ANNUAL PREVENTATIVE CARE ENCOUNTER NOTE  Subjective:   Ashlee Campbell is a 39 y.o. [id number] female here for a routine annual gynecologic exam.  Current complaints: None- feeling good. Last pap was some years ago.   Denies abnormal vaginal bleeding, discharge, pelvic pain, problems with intercourse or other gynecologic concerns.    Gynecologic History Patient's last menstrual period was 11/22/2019 (exact date). Contraception: tubal ligation Last Pap: WNL per patient Last mammogram: NA  The following portions of the patient's history were reviewed and updated as appropriate: allergies, current medications, past family history, past medical history, past social history, past surgical history and problem list.  Review of Systems Pertinent items are noted in HPI.   Objective:  BP 134/89   Pulse 90   Ht 5\' 1"  (1.549 m)   Wt 235 lb (106.6 kg)   LMP 11/22/2019 (Exact Date)   BMI 44.40 kg/m  CONSTITUTIONAL: Well-developed, well-nourished female in no acute distress.  HENT:  Normocephalic, atraumatic, External right and left ear normal. Oropharynx is clear and moist EYES:  No scleral icterus.  NECK: Normal range of motion, supple, no masses.  Normal thyroid.  SKIN: Skin is warm and dry. No rash noted. Not diaphoretic. No erythema. No pallor. NEUROLOGIC: Alert and oriented to person, place, and time. Normal reflexes, muscle tone coordination. No cranial nerve deficit noted. PSYCHIATRIC: Normal mood and affect. Normal behavior. Normal judgment and thought content. CARDIOVASCULAR: Normal heart rate noted, regular rhythm. 2+ distal pulses. RESPIRATORY: Effort and breath sounds normal, no problems with respiration noted. BREASTS: Symmetric in size. No masses, skin changes, nipple drainage, or lymphadenopathy. ABDOMEN: Soft,  no distention noted.  No tenderness, rebound or guarding.  PELVIC: Normal appearing external genitalia; normal appearing vaginal mucosa and cervix.  No  abnormal discharge noted.  Pap smear obtained.  Normal uterine size, no other palpable masses, no uterine or adnexal tenderness. MUSCULOSKELETAL: Normal range of motion.     Assessment and Plan:  1) Annual gynecologic examination with pap smear:  Will follow up results of pap smear and manage accordingly. Declines STI screening. Reviewed breast awareness and self exam. Reviewed starting mammograms at age 71. Health lifestyle measures emphasized. Routine preventative health maintenance measures emphasized.  Orders Placed This Encounter  Procedures  . Flu Vaccine QUAD 36+ mos IM (Fluarix, Quad PF)  . CBC  . TSH  . Hemoglobin A1c  . CMP  . Lipid panel  . Vitamin D (25 hydroxy)    Please refer to After Visit Summary for other counseling recommendations.   Return in about 1 year (around 11/29/2020) for Yearly wellness exam.  12/01/2020, MD, MPH, ABFM Attending Physician [facility]

## 2019-11-30 NOTE — Progress Notes (Signed)
Last pap was about 2-3 years ago, WNL  Transferred from [facility]

## 2019-12-01 LAB — CBC
Hematocrit: 40.5 % (ref 34.0–46.6)
Hemoglobin: 13.1 g/dL (ref 11.1–15.9)
MCH: 26.1 pg — ABNORMAL LOW (ref 26.6–33.0)
MCHC: 32.3 g/dL (ref 31.5–35.7)
MCV: 81 fL (ref 79–97)
Platelets: 333 10*3/uL (ref 150–450)
RBC: 5.01 x10E6/uL (ref 3.77–5.28)
RDW: 14.5 % (ref 11.7–15.4)
WBC: 10.4 10*3/uL (ref 3.4–10.8)

## 2019-12-01 LAB — COMPREHENSIVE METABOLIC PANEL
ALT: 27 IU/L (ref 0–32)
AST: 27 IU/L (ref 0–40)
Albumin/Globulin Ratio: 1.1 — ABNORMAL LOW (ref 1.2–2.2)
Albumin: 3.9 g/dL (ref 3.8–4.8)
Alkaline Phosphatase: 122 IU/L — ABNORMAL HIGH (ref 39–117)
BUN/Creatinine Ratio: 6 — ABNORMAL LOW (ref 9–23)
BUN: 7 mg/dL (ref 6–20)
Bilirubin Total: 0.4 mg/dL (ref 0.0–1.2)
CO2: 24 mmol/L (ref 20–29)
Calcium: 9.1 mg/dL (ref 8.7–10.2)
Chloride: 100 mmol/L (ref 96–106)
Creatinine, Ser: 1.08 mg/dL — ABNORMAL HIGH (ref 0.57–1.00)
GFR calc Af Amer: 75 mL/min/{1.73_m2} (ref >59)
GFR calc non Af Amer: 65 mL/min/{1.73_m2} (ref >59)
Globulin, Total: 3.5 g/dL (ref 1.5–4.5)
Glucose: 84 mg/dL (ref 65–99)
Potassium: 3.8 mmol/L (ref 3.5–5.2)
Sodium: 140 mmol/L (ref 134–144)
Total Protein: 7.4 g/dL (ref 6.0–8.5)

## 2019-12-01 LAB — LIPID PANEL
Chol/HDL Ratio: 3.2 ratio (ref 0.0–4.4)
Cholesterol, Total: 187 mg/dL (ref 100–199)
HDL: 58 mg/dL (ref >39)
LDL Chol Calc (NIH): 104 mg/dL — ABNORMAL HIGH (ref 0–99)
Triglycerides: 145 mg/dL (ref 0–149)
VLDL Cholesterol Cal: 25 mg/dL (ref 5–40)

## 2019-12-01 LAB — HEMOGLOBIN A1C
Est. average glucose Bld gHb Est-mCnc: 100 mg/dL
Hgb A1c MFr Bld: 5.1 % (ref 4.8–5.6)

## 2019-12-01 LAB — VITAMIN D 25 HYDROXY (VIT D DEFICIENCY, FRACTURES): Vit D, 25-Hydroxy: 14.8 ng/mL — ABNORMAL LOW (ref 30.0–100.0)

## 2019-12-01 LAB — TSH: TSH: 0.812 u[IU]/mL (ref 0.450–4.500)

## 2019-12-07 LAB — IGP, APTIMA HPV, RFX 16/18,45
HPV Aptima: NEGATIVE
PAP Smear Comment: 0

## 2019-12-09 ENCOUNTER — Other Ambulatory Visit: Payer: Self-pay | Admitting: *Deleted

## 2019-12-09 MED ORDER — VITAMIN D (ERGOCALCIFEROL) 1.25 MG (50000 UNIT) PO CAPS: 50000.0000 [IU] | ORAL_CAPSULE | ORAL | 0 refills | Status: DC

## 2019-12-09 NOTE — Progress Notes (Signed)
vitm 

## 2020-01-26 ENCOUNTER — Other Ambulatory Visit: Payer: Self-pay | Admitting: Family Medicine

## 2020-04-06 ENCOUNTER — Other Ambulatory Visit: Payer: Self-pay | Admitting: Family Medicine

## 2020-04-10 ENCOUNTER — Other Ambulatory Visit: Payer: Self-pay

## 2020-04-11 MED ORDER — VITAMIN D (ERGOCALCIFEROL) 1.25 MG (50000 UNIT) PO CAPS: 50000.0000 [IU] | ORAL_CAPSULE | ORAL | 0 refills | Status: DC

## 2020-05-27 ENCOUNTER — Other Ambulatory Visit: Payer: Self-pay | Admitting: Family Medicine

## 2020-05-30 ENCOUNTER — Other Ambulatory Visit: Payer: Self-pay

## 2020-09-26 ENCOUNTER — Encounter: Payer: Self-pay | Admitting: Radiology

## 2020-11-13 ENCOUNTER — Other Ambulatory Visit: Payer: Self-pay

## 2020-11-13 ENCOUNTER — Encounter: Payer: Self-pay | Admitting: Family Medicine

## 2020-11-13 ENCOUNTER — Ambulatory Visit (INDEPENDENT_AMBULATORY_CARE_PROVIDER_SITE_OTHER): Payer: BC Managed Care – PPO | Admitting: Family Medicine

## 2020-11-13 VITALS — BP 110/80 | HR 96 | Temp 98.0°F | Ht 60.0 in | Wt 240.8 lb

## 2020-11-13 DIAGNOSIS — K645 Perianal venous thrombosis: Secondary | ICD-10-CM

## 2020-11-13 DIAGNOSIS — K644 Residual hemorrhoidal skin tags: Secondary | ICD-10-CM | POA: Diagnosis not present

## 2020-11-13 DIAGNOSIS — Z23 Encounter for immunization: Secondary | ICD-10-CM

## 2020-11-13 MED ORDER — HYDROCORTISONE ACETATE 25 MG RE SUPP: 25.0000 mg | Freq: Two times a day (BID) | RECTAL | 0 refills | Status: DC

## 2020-11-13 NOTE — Progress Notes (Signed)
Subjective:     Ashlee Campbell is a 39 y.o. female presenting for Establish Care     HPI   #Hemorrhoid - just happened - recurring issue - very bad currently - wanting to know if treatment - painful - no bleeding - treatment: preparation H and warm baths TID w/ epson salt - no improvement - symptoms started 1 week ago - BM - every 2-3 days is normal habit, has been straining more over the last week   Review of Systems   Social History   Tobacco Use  Smoking Status Never Smoker  Smokeless Tobacco Never Used        Objective:    BP Readings from Last 3 Encounters:  11/13/20 110/80  11/30/19 134/89   Wt Readings from Last 3 Encounters:  11/13/20 240 lb 12 oz (109.2 kg)  11/30/19 235 lb (106.6 kg)    BP 110/80   Pulse 96   Temp 98 F (36.7 C) (Temporal)   Ht 5' (1.524 m)   Wt 240 lb 12 oz (109.2 kg)   LMP 10/19/2020   SpO2 96%   BMI 47.02 kg/m    Physical Exam Exam conducted with a chaperone present.  Constitutional:      General: She is not in acute distress.    Appearance: She is well-developed. She is not diaphoretic.  HENT:     Right Ear: External ear normal.     Left Ear: External ear normal.  Eyes:     Conjunctiva/sclera: Conjunctivae normal.  Cardiovascular:     Rate and Rhythm: Normal rate and regular rhythm.     Heart sounds: No murmur heard.   Pulmonary:     Effort: Pulmonary effort is normal. No respiratory distress.     Breath sounds: Normal breath sounds. No wheezing.  Genitourinary:    Rectum: External hemorrhoid present.     Comments: One firm, painful thrombosed external hemorrhoid. A second soft non painful external hemorrhoid. Internal exam deferred  Musculoskeletal:     Cervical back: Neck supple.  Skin:    General: Skin is warm and dry.     Capillary Refill: Capillary refill takes less than 2 seconds.  Neurological:     Mental Status: She is alert. Mental status is at baseline.  Psychiatric:        Mood and  Affect: Mood normal.        Behavior: Behavior normal.           Assessment & Plan:   Problem List Items Addressed This Visit      Cardiovascular and Mediastinum   External hemorrhoid - Primary    Discussed not straining with BM and taking medication to avoid constipation. Cont sitz baths. Suppository prescribed. Discussed that we could attempt treatment for thrombosed hemorrhoid but as pt interested in definitive surgery for recurrent hemorrhoids elected to refer to surgery for evaluation and treatment and start conservative measures until she is able to meet with surgeon.       Relevant Medications   hydrocortisone (ANUSOL-HC) 25 MG suppository    Other Visit Diagnoses    Thrombosed external hemorrhoid       Relevant Medications   hydrocortisone (ANUSOL-HC) 25 MG suppository   Other Relevant Orders   Ambulatory referral to General Surgery   Need for influenza vaccination       Relevant Orders   Flu Vaccine QUAD 6+ mos PF IM (Fluarix Quad PF) (Completed)   Need for Tdap vaccination  Relevant Orders   Tdap vaccine greater than or equal to 7yo IM (Completed)       Return if symptoms worsen or fail to improve.  Lynnda Child, MD  This visit occurred during the SARS-CoV-2 public health emergency.  Safety protocols were in place, including screening questions prior to the visit, additional usage of staff PPE, and extensive cleaning of exam room while observing appropriate contact time as indicated for disinfecting solutions.

## 2020-11-13 NOTE — Patient Instructions (Addendum)
You have a Hemorrhoid  Here are ways to prevent future issues and to treat your hemorrhoid 1) Make sure you get 25-35 g of INSOLUBLE fiber a day -- ideally from diet but you can also take a fiber supplement like Metamucil 2) Drink 1.5 to 2 liters of water daily  3) Avoid straining and prolonged periods on the toilet 4) Limit fatty foods and alcohol 5) Try to lose weight  How to care for your hemorrhoid now 1) Sitz Baths and warm water sprays 2) Wipe with a wet wipe or flushable wipe  3) Consider using stool softeners (Like Colace or Miralax) 4) Topical Hemorrhoid cream (Preparation H is available over the counter)  5) Steroid suppository - though can be expensive    #Referral I have placed a referral to a specialist for you. You should receive a phone call from the specialty office. Make sure your voicemail is not full and that if you are able to answer your phone to unknown or new numbers.   It may take up to 2 weeks to hear about the referral. If you do not hear anything in 2 weeks, please call our office and ask to speak with the referral coordinator.       

## 2020-11-13 NOTE — Assessment & Plan Note (Signed)
Discussed not straining with BM and taking medication to avoid constipation. Cont sitz baths. Suppository prescribed. Discussed that we could attempt treatment for thrombosed hemorrhoid but as pt interested in definitive surgery for recurrent hemorrhoids elected to refer to surgery for evaluation and treatment and start conservative measures until she is able to meet with surgeon.

## 2020-11-21 ENCOUNTER — Ambulatory Visit: Payer: BC Managed Care – PPO | Admitting: Surgery

## 2020-11-21 ENCOUNTER — Encounter: Payer: Self-pay | Admitting: Surgery

## 2020-11-21 ENCOUNTER — Other Ambulatory Visit: Payer: Self-pay

## 2020-11-21 VITALS — BP 149/94 | HR 89 | Temp 98.9°F | Ht 60.0 in | Wt 242.0 lb

## 2020-11-21 DIAGNOSIS — K645 Perianal venous thrombosis: Secondary | ICD-10-CM

## 2020-11-21 MED ORDER — LIDOCAINE 5 % EX OINT: 1.0000 "application " | TOPICAL_OINTMENT | Freq: Three times a day (TID) | CUTANEOUS | 0 refills | Status: DC | PRN

## 2020-11-21 NOTE — Progress Notes (Signed)
11/21/2020  Reason for Visit:  Thrombosed external hemorrhoid  Referring Provider:  Gweneth Dimitri, MD  History of Present Illness: Ashlee Campbell is a 40 y.o. female presenting for evaluation of a thrombosed external hemorrhoid.  The patient reports having some flare-ups in the past of external hemorrhoids, but she's always been able to use over the counter medications and after a few days, the symptoms improve.  Her most recent episode started about 12/21, and as it was not improving, saw her PCP on 12/28.  At that point, she was noted to have a thrombosed external hemorrhoid of the left lateral column.  She was given Anusol suppositories and referred to [place] for evaluation.  The patient has been doing Sitz baths as indicated as well.  Of note, the patient typically has bowel movement every 2-3 days, and sometimes has to strain more.  Denies any blood in the stool, and her main symptom is pain in the perianal area.  Past Medical History: Past Medical History:  Diagnosis Date  . Generalized headaches   . History of UTI      Past Surgical History: Past Surgical History:  Procedure Laterality Date  . CESAREAN SECTION  2002  . CESAREAN SECTION  2005  . CESAREAN SECTION  2009  . DILATION AND CURETTAGE, DIAGNOSTIC / THERAPEUTIC    . TUBAL LIGATION      Home Medications: Prior to Admission medications   Medication Sig Start Date End Date Taking? Authorizing Provider  Cholecalciferol (VITAMIN D-3) 125 MCG (5000 UT) TABS Take 1 tablet by mouth daily.   Yes [provider]  hydrocortisone (ANUSOL-HC) 25 MG suppository Place 1 suppository (25 mg total) rectally 2 (two) times daily. 11/13/20  Yes Lynnda Child, MD  lidocaine (XYLOCAINE) 5 % ointment Apply 1 application topically 3 (three) times daily as needed. 11/21/20  Yes Nolberto Cheuvront, Elita Quick, MD  loratadine (CLARITIN) 10 MG tablet Take 10 mg by mouth daily.   Yes [provider]  Nutritional Supplements (NUTRITIONAL SUPPLEMENT  PO) Take 1 tablet by mouth daily. Cranberry Fruit Extract 25,000 mg   Yes [provider]  Nutritional Supplements (NUTRITIONAL SUPPLEMENT PO) Take 300 mg by mouth daily. Goli Ashwagandha   Yes [provider]  NUTRITIONAL SUPPLEMENTS PO Take 2 each by mouth daily. Goli Web designer   Yes [provider]  Probiotic Product (PROBIOTIC PO) Take 2 tablets by mouth daily.   Yes [provider]    Allergies: Allergies  Allergen Reactions  . Penicillins Hives    Social History:  reports that she has never smoked. She has never used smokeless tobacco. She reports current alcohol use. She reports previous drug use.   Family History: Family History  Problem Relation Age of Onset  . Hypertension Mother   . Arrhythmia Maternal Grandmother   . Lung cancer Maternal Grandfather        smoker    Review of Systems: Review of Systems  Constitutional: Negative for chills and fever.  Respiratory: Negative for shortness of breath.   Cardiovascular: Negative for chest pain.  Gastrointestinal: Negative for abdominal pain, blood in stool, nausea and vomiting.  Genitourinary: Negative for dysuria.  Musculoskeletal: Negative for myalgias.  Skin: Negative for rash.  Neurological: Negative for dizziness.  Psychiatric/Behavioral: Negative for depression.    Physical Exam BP (!) 149/94   Pulse 89   Temp 98.9 F (37.2 C)   Ht 5' (1.524 m)   Wt 242 lb (109.8 kg)   LMP 10/19/2020  SpO2 95%   BMI 47.26 kg/m  CONSTITUTIONAL: No acute distress HEENT:  Normocephalic, atraumatic, extraocular motion intact. NECK: Trachea is midline, and there is no jugular venous distension.  RESPIRATORY:  Normal respiratory effort without pathologic use of accessory muscles. CARDIOVASCULAR:  Regular rhythm and rate. GI: The abdomen is soft, obese, non-distended. RECTAL:  External exam reveals an enlarged, somewhat firm external hemorrhoid of the left lateral column.  She  also has an enlarged, but very soft and non-inflamed external hemorrhoid of right anterior column.  Overall normal to minimally enlarged right posterior. Digital rectal exam reveals enlarged internal component to the left lateral column, but otherwise normal.  No gross blood. MUSCULOSKELETAL:  Normal muscle strength and tone in all four extremities.  No peripheral edema or cyanosis. SKIN: Skin turgor is normal. There are no pathologic skin lesions.  NEUROLOGIC:  Motor and sensation is grossly normal.  Cranial nerves are grossly intact. PSYCH:  Alert and oriented to person, place and time. Affect is normal.  Laboratory Analysis: No results found for this or any previous visit (from the past 24 hour(s)).  Imaging: No results found.  Assessment and Plan: This is a 40 y.o. female with thrombosed external hemorrhoid.  --Discussed with the patient that at the point of visit with her PCP and particularly now, there is no indication for I&D and clot evacuation, as this will only create more inflammation and pain in the area.  Typically this is better to be done over the first 72 hrs. --At this point, the mainstay of treatment is symptomatic management. Discussed with her the role for Anusol to help with inflammation, Lidocaine ointment to help with pain, Sitz baths to help soothe the tissue, MiraLax or stool softeners to have a regular soft daily bowel movement without straining, and possibly using Tylenol and Ibuprofen for pain control. --She will follow up in three weeks to evaluate the perianal area again.  Discussed briefly with her the role for surgery, but unfortunately there is a considerable amount of pain with hemorrhoidectomy.  At this point she's not interested in surgery, which I think is reasonable.  Will discuss more on follow up.  Face-to-face time spent with the patient and care providers was 40 minutes, with more than 50% of the time spent counseling, educating, and coordinating care of  the patient.     Howie Ill, MD [facility]

## 2020-11-21 NOTE — Patient Instructions (Addendum)
We have sent Lidocaine ointment to your pharmacy. Please try the below and see if these work for you. Please see your follow up appointment listed below.   Take Miralax daily. You may also take Colace stool softener.  You can take Metamucil daily. Be sure to increase your water intake daily.   Fiber Content in Foods  See the following list for the dietary fiber content of some common foods. High-fiber foods High-fiber foods contain 4 grams or more (4g or more) of fiber per serving. They include:  Artichoke (fresh) - 1 medium has 10.3g of fiber.  Baked beans, plain or vegetarian (canned) -  cup has 5.2g of fiber.  Blackberries or raspberries (fresh) -  cup has 4g of fiber.  Bran cereal -  cup has 8.6g of fiber.  Bulgur (cooked) -  cup has 4g of fiber.  Kidney beans (canned) -  cup has 6.8g of fiber.  Lentils (cooked) -  cup has 7.8g of fiber.  Pear (fresh) - 1 medium has 5.1g of fiber.  Peas (frozen) -  cup has 4.4g of fiber.  Pinto beans (canned) -  cup has 5.5g of fiber.  Pinto beans (dried and cooked) -  cup has 7.7g of fiber.  Potato with skin (baked) - 1 medium has 4.4g of fiber.  Quinoa (cooked) -  cup has 5g of fiber.  Soybeans (canned, frozen, or fresh) -  cup has 5.1g of fiber. Moderate-fiber foods Moderate-fiber foods contain 1-4 grams (1-4g) of fiber per serving. They include:  Almonds - 1 oz. has 3.5g of fiber.  Apple with skin - 1 medium has 3.3g of fiber.  Applesauce, sweetened -  cup has 1.5g of fiber.  Bagel, plain - one 4-inch (10-cm) bagel has 2g of fiber.  Banana - 1 medium has 3.1g of fiber.  Broccoli (cooked) -  cup has 2.5g of fiber.  Carrots (cooked) -  cup has 2.3g of fiber.  Corn (canned or frozen) -  cup has 2.1g of fiber.  Corn tortilla - one 6-inch (15-cm) tortilla has 1.5g of fiber.  Green beans (canned) -  cup has 2g of fiber.  Instant oatmeal -  cup has about 2g of fiber.  Long-grain brown rice (cooked) - 1  cup has 3.5g of fiber.  Macaroni, enriched (cooked) - 1 cup has 2.5g of fiber.  Melon - 1 cup has 1.4g of fiber.  Multigrain cereal -  cup has about 2-4g of fiber.  Orange - 1 small has 3.1g of fiber.  Potatoes, mashed -  cup has 1.6g of fiber.  Raisins - 1/4 cup has 1.6g of fiber.  Squash -  cup has 2.9g of fiber.  Sunflower seeds -  cup has 1.1g of fiber.  Tomato - 1 medium has 1.5g of fiber.  Vegetable or soy patty - 1 has 3.4g of fiber.  Whole-wheat bread - 1 slice has 2g of fiber.  Whole-wheat spaghetti -  cup has 3.2g of fiber. Low-fiber foods Low-fiber foods contain less than 1 gram (less than 1g) of fiber per serving. They include:  Egg - 1 large.  Flour tortilla - one 6-inch (15-cm) tortilla.  Fruit juice -  cup.  Lettuce - 1 cup.  Meat, poultry, or fish - 1 oz.  Milk - 1 cup.  Spinach (raw) - 1 cup.  White bread - 1 slice.  White rice -  cup.  Yogurt -  cup. Actual amounts of fiber in foods may be different depending on processing. Talk  with your dietitian about how much fiber you need in your diet. This information is not intended to replace advice given to you by your health care provider. Make sure you discuss any questions you have with your health care provider. Document Revised: 06/26/2016 Document Reviewed: 12/27/2015 Elsevier Patient Education  2020 [employer].   How to Take a [employer] A sitz bath is a warm water bath that may be used to care for your rectum, genital area, or the area between your rectum and genitals (perineum). For a sitz bath, the water only comes up to your hips and covers your buttocks. A sitz bath may done at home in a bathtub or with a portable sitz bath that fits over the toilet. Your health care provider may recommend a sitz bath to help:  Relieve pain and discomfort after delivering a baby.  Relieve pain and itching from hemorrhoids or anal fissures.  Relieve pain after certain surgeries.  Relax  muscles that are sore or tight. How to take a sitz bath Take 3-4 sitz baths a day, or as many as told by your health care provider. Bathtub sitz bath To take a sitz bath in a bathtub: 1. Partially fill a bathtub with warm water. The water should be deep enough to cover your hips and buttocks when you are sitting in the tub. 2. If your health care provider told you to put medicine in the water, follow his or her instructions. 3. Sit in the water. 4. Open the tub drain a little, and leave it open during your bath. 5. Turn on the warm water again, enough to replace the water that is draining out. Keep the water running throughout your bath. This helps keep the water at the right level and the right temperature. 6. Soak in the water for 15-20 minutes, or as long as told by your health care provider. 7. When you are done, be careful when you stand up. You may feel dizzy. 8. After the sitz bath, pat yourself dry. Do not rub your skin to dry it.  Over-the-toilet sitz bath To take a sitz bath with an over-the-toilet basin: 1. Follow the manufacturer's instructions. 2. Fill the basin with warm water. 3. If your health care provider told you to put medicine in the water, follow his or her instructions. 4. Sit on the seat. Make sure the water covers your buttocks and perineum. 5. Soak in the water for 15-20 minutes, or as long as told by your health care provider. 6. After the sitz bath, pat yourself dry. Do not rub your skin to dry it. 7. Clean and dry the basin between uses. 8. Discard the basin if it cracks, or according to the manufacturer's instructions. Contact a health care provider if:  Your symptoms get worse. Do not continue with sitz baths if your symptoms get worse.  You have new symptoms. If this happens, do not continue with sitz baths until you talk with your health care provider. Summary  A sitz bath is a warm water bath in which the water only comes up to your hips and covers  your buttocks.  A sitz bath may help relieve itching, relieve pain, and relax muscles that are sore or tight in the lower part of your body, including your genital area.  Take 3-4 sitz baths a day, or as many as told by your health care provider. Soak in the water for 15-20 minutes.  Do not continue with sitz baths if your symptoms  get worse. This information is not intended to replace advice given to you by your health care provider. Make sure you discuss any questions you have with your health care provider. Document Revised: 04/04/2019 Document Reviewed: 11/05/2017 Elsevier Patient Education  2020 [employer]. Hemorrhoids Hemorrhoids are swollen veins that may develop:  In the butt (rectum). These are called internal hemorrhoids.  Around the opening of the butt (anus). These are called external hemorrhoids. Hemorrhoids can cause pain, itching, or bleeding. Most of the time, they do not cause serious problems. They usually get better with diet changes, lifestyle changes, and other home treatments. What are the causes? This condition may be caused by:  Having trouble pooping (constipation).  Pushing hard (straining) to poop.  Watery poop (diarrhea).  Pregnancy.  Being very overweight (obese).  Sitting for long periods of time.  Heavy lifting or other activity that causes you to strain.  Anal sex.  Riding a bike for a long period of time. What are the signs or symptoms? Symptoms of this condition include:  Pain.  Itching or soreness in the butt.  Bleeding from the butt.  Leaking poop.  Swelling in the area.  One or more lumps around the opening of your butt. How is this diagnosed? A doctor can often diagnose this condition by looking at the affected area. The doctor may also:  Do an exam that involves feeling the area with a gloved hand (digital rectal exam).  Examine the area inside your butt using a small tube (anoscope).  Order blood tests. This may be  done if you have lost a lot of blood.  Have you get a test that involves looking inside the colon using a flexible tube with a camera on the end (sigmoidoscopy or colonoscopy). How is this treated? This condition can usually be treated at home. Your doctor may tell you to change what you eat, make lifestyle changes, or try home treatments. If these do not help, procedures can be done to remove the hemorrhoids or make them smaller. These may involve:  Placing rubber bands at the base of the hemorrhoids to cut off their blood supply.  Injecting medicine into the hemorrhoids to shrink them.  Shining a type of light energy onto the hemorrhoids to cause them to fall off.  Doing surgery to remove the hemorrhoids or cut off their blood supply. Follow these instructions at home: Eating and drinking   Eat foods that have a lot of fiber in them. These include whole grains, beans, nuts, fruits, and vegetables.  Ask your doctor about taking products that have added fiber (fibersupplements).  Reduce the amount of fat in your diet. You can do this by: ? Eating low-fat dairy products. ? Eating less red meat. ? Avoiding processed foods.  Drink enough fluid to keep your pee (urine) pale yellow. Managing pain and swelling   Take a warm-water bath (sitz bath) for 20 minutes to ease pain. Do this 3-4 times a day. You may do this in a bathtub or using a portable sitz bath that fits over the toilet.  If told, put ice on the painful area. It may be helpful to use ice between your warm baths. ? Put ice in a plastic bag. ? Place a towel between your skin and the bag. ? Leave the ice on for 20 minutes, 2-3 times a day. General instructions  Take over-the-counter and prescription medicines only as told by your doctor. ? Medicated creams and medicines may be used as  told.  Exercise often. Ask your doctor how much and what kind of exercise is best for you.  Go to the bathroom when you have the urge to  poop. Do not wait.  Avoid pushing too hard when you poop.  Keep your butt dry and clean. Use wet toilet paper or moist towelettes after pooping.  Do not sit on the toilet for a long time.  Keep all follow-up visits as told by your doctor. This is important. Contact a doctor if you:  Have pain and swelling that do not get better with treatment or medicine.  Have trouble pooping.  Cannot poop.  Have pain or swelling outside the area of the hemorrhoids. Get help right away if you have:  Bleeding that will not stop. Summary  Hemorrhoids are swollen veins in the butt or around the opening of the butt.  They can cause pain, itching, or bleeding.  Eat foods that have a lot of fiber in them. These include whole grains, beans, nuts, fruits, and vegetables.  Take a warm-water bath (sitz bath) for 20 minutes to ease pain. Do this 3-4 times a day. This information is not intended to replace advice given to you by your health care provider. Make sure you discuss any questions you have with your health care provider. Document Revised: 11/11/2018 Document Reviewed: 03/25/2018 Elsevier Patient Education  2020 [employer].

## 2020-12-12 ENCOUNTER — Ambulatory Visit (INDEPENDENT_AMBULATORY_CARE_PROVIDER_SITE_OTHER): Payer: BC Managed Care – PPO | Admitting: Surgery

## 2020-12-12 ENCOUNTER — Other Ambulatory Visit: Payer: Self-pay

## 2020-12-12 ENCOUNTER — Encounter: Payer: Self-pay | Admitting: Surgery

## 2020-12-12 VITALS — BP 121/55 | HR 111 | Temp 98.5°F | Ht 60.0 in | Wt 238.0 lb

## 2020-12-12 DIAGNOSIS — K645 Perianal venous thrombosis: Secondary | ICD-10-CM

## 2020-12-12 NOTE — Patient Instructions (Signed)
If you have any concerns or questions, please feel free to call our office.     How to Take a [employer] A sitz bath is a warm water bath that may be used to care for your rectum, genital area, or the area between your rectum and genitals (perineum). In a sitz bath, the water only comes up to your hips and covers your buttocks. A sitz bath may be done in a bathtub or with a portable sitz bath that fits over the toilet. Your health care provider may recommend a sitz bath to help:  Relieve pain and discomfort after delivering a baby.  Relieve pain and itching from hemorrhoids or anal fissures.  Relieve pain after certain surgeries.  Relax muscles that are sore or tight. How to take a sitz bath Take 3-4 sitz baths a day, or as many as told by your health care provider. Bathtub sitz bath To take a sitz bath in a bathtub: 1. Partially fill a bathtub with warm water. The water should be deep enough to cover your hips and buttocks when you are sitting in the tub. 2. Follow your health care provider's instructions if you are told to put medicine in the water. 3. Sit in the water. Open the tub drain a little, and leave it open during your bath. 4. Turn on the warm water again, enough to replace the water that is draining out. Keep the water running throughout your bath. This helps keep the water at the right level and temperature. 5. Soak in the water for 15-20 minutes, or as long as told by your health care provider. 6. When you are done, be careful when you stand up. You may feel dizzy. 7. After the sitz bath, pat yourself dry. Do not rub your skin to dry it.   Over-the-toilet sitz bath To take a sitz bath with an over-the-toilet basin: 1. Follow the manufacturer's instructions. 2. Fill the basin with warm water. 3. Follow your health care provider's instructions if you were told to put medicine in the water. 4. Sit on the seat. Make sure the water covers your buttocks and perineum. 5. Soak  in the water for 15-20 minutes, or as long as told by your health care provider. 6. After the sitz bath, pat yourself dry. Do not rub your skin to dry it. 7. Clean and dry the basin between uses. 8. Discard the basin if it cracks, or according to the manufacturer's instructions.   Contact a health care provider if:  Your pain or itching gets worse. Do not continue with sitz baths if your symptoms get worse.  You have new symptoms. Do not continue with sitz baths until you talk with your health care provider. Summary  A sitz bath is a warm water bath in which the water only comes up to your hips and covers your buttocks.  A sitz bath may help relieve pain and discomfort after delivering a baby. It also may help with pain and itching from hemorrhoids or anal fissures, or pain after certain surgeries. It can also help to relax muscles that are sore or tight.  Take 3-4 sitz baths a day, or as many as told by your health care provider. Soak in the water for 15-20 minutes.  Do not continue with sitz baths if your symptoms get worse. This information is not intended to replace advice given to you by your health care provider. Make sure you discuss any questions you have with your health care  provider. Document Revised: 07/19/2020 Document Reviewed: 07/19/2020 Elsevier Patient Education  2021 Elsevier Inc.     Constipation, Adult Constipation is when a person has trouble pooping (having a bowel movement). When you have this condition, you may poop fewer than 3 times a week. Your poop (stool) may also be dry, hard, or bigger than normal. Follow these instructions at home: Eating and drinking  Eat foods that have a lot of fiber, such as: ? Fresh fruits and vegetables. ? Whole grains. ? Beans.  Eat less of foods that are low in fiber and high in fat and sugar, such as: ? [place] fries. ? Hamburgers. ? Cookies. ? Candy. ? Soda.  Drink enough fluid to keep your pee (urine) pale yellow.    General instructions  Exercise regularly or as told by your doctor. Try to do 150 minutes of exercise each week.  Go to the restroom when you feel like you need to poop. Do not hold it in.  Take over-the-counter and prescription medicines only as told by your doctor. These include any fiber supplements.  When you poop: ? Do deep breathing while relaxing your lower belly (abdomen). ? Relax your pelvic floor. The pelvic floor is a group of muscles that support the rectum, bladder, and intestines (as well as the uterus in women).  Watch your condition for any changes. Tell your doctor if you notice any.  Keep all follow-up visits as told by your doctor. This is important. Contact a doctor if:  You have pain that gets worse.  You have a fever.  You have not pooped for 4 days.  You vomit.  You are not hungry.  You lose weight.  You are bleeding from the opening of the butt (anus).  You have thin, pencil-like poop. Get help right away if:  You have a fever, and your symptoms suddenly get worse.  You leak poop or have blood in your poop.  Your belly feels hard or bigger than normal (bloated).  You have very bad belly pain.  You feel dizzy or you faint. Summary  Constipation is when a person poops fewer than 3 times a week, has trouble pooping, or has poop that is dry, hard, or bigger than normal.  Eat foods that have a lot of fiber.  Drink enough fluid to keep your pee (urine) pale yellow.  Take over-the-counter and prescription medicines only as told by your doctor. These include any fiber supplements. This information is not intended to replace advice given to you by your health care provider. Make sure you discuss any questions you have with your health care provider. Document Revised: 09/21/2019 Document Reviewed: 09/21/2019 Elsevier Patient Education  2021 Elsevier Inc.     Surgical Procedures for Hemorrhoids Surgical procedures can be used to treat  hemorrhoids. Hemorrhoids are swollen veins that are inside the rectum (internal hemorrhoids) or around the anus (external hemorrhoids). They are caused by increased pressure in the anal area. This pressure may result from straining to have a bowel movement (constipation), diarrhea, pregnancy, obesity, or sitting for long periods of time. Hemorrhoids can cause symptoms such as pain and bleeding. Surgery may be needed if diet changes, lifestyle changes, and other treatments do not help your symptoms. Common surgical methods that may be used include:  Closed hemorrhoidectomy. The hemorrhoids are surgically removed, and the incisions are closed with stitches (sutures).  Open hemorrhoidectomy. The hemorrhoids are surgically removed, but the incisions are allowed to heal without sutures.  Stapled hemorrhoidectomy. The  hemorrhoids are partially removed, and the incisions are closed with staples. Tell a health care provider about:  Any allergies you have.  All medicines you are taking, including vitamins, herbs, eye drops, creams, and over-the-counter medicines.  Any problems you or family members have had with anesthetic medicines.  Any blood disorders you have.  Any surgeries you have had.  Any medical conditions you have.  Whether you are pregnant or may be pregnant. What are the risks? Generally, this is a safe procedure. However, problems may occur, including:  Infection.  Bleeding.  Allergic reactions to medicines.  Damage to other structures or organs.  Pain.  Constipation.  Difficulty passing urine.  Narrowing of the anal canal (stenosis).  Difficulty controlling bowel movements (incontinence).  Recurring hemorrhoids.  A new passage (fistula) that forms between the anus or rectum and another area. What happens before the procedure? Medicines  Ask your health care provider about: ? Changing or stopping your regular medicines. This is especially important if you are  taking diabetes medicines or blood thinners. ? Taking medicines such as aspirin and ibuprofen. These medicines can thin your blood. Do not take these medicines unless your health care provider tells you to take them. ? Taking over-the-counter medicines, vitamins, herbs, and supplements. Staying hydrated Follow instructions from your health care provider about hydration, which may include:  Up to 2 hours before the procedure - you may continue to drink clear liquids, such as water, clear fruit juice, black coffee, and plain tea.   Eating and drinking Follow instructions from your health care provider about eating and drinking, which may include:  8 hours before the procedure - stop eating heavy meals or foods, such as meat, fried foods, or fatty foods.  6 hours before the procedure - stop eating light meals or foods, such as toast or cereal.  6 hours before the procedure - stop drinking milk or drinks that contain milk.  2 hours before the procedure - stop drinking clear liquids. General instructions  You may need to have a procedure to examine the inside of your colon with a scope (colonoscopy). Your health care provider may do this to make sure that there are no other causes for your bleeding or pain.  You may be instructed to take a laxative and an enema to clean out your colon before surgery (bowel prep). Carefully follow instructions from your health care provider about bowel prep.  Plan to have someone take you home from the hospital or clinic.  Plan to have a responsible adult care for you for at least 24 hours after you leave the hospital or clinic. This is important.  Ask your health care provider: ? How your surgery site will be marked. ? What steps will be taken to help prevent infection. These may include:  Washing skin with a germ-killing soap.  Taking antibiotic medicine. What happens during the procedure?  An IV will be inserted into one of your veins.  You will be  given one or more of the following: ? A medicine to help you relax (sedative). ? A medicine to numb the area (local anesthetic). ? A medicine to make you fall asleep (general anesthetic). ? A medicine that is injected into an area of your body to numb everything below the injection site (regional anesthetic).  A lubricating jelly may be placed into your rectum.  Your surgeon will insert a short scope (anoscope) into your rectum to examine the hemorrhoids.  One of the following surgical  methods will be used to remove the hemorrhoids: ? Closed hemorrhoidectomy.  Your surgeon will use surgical instruments to open the tissue around the hemorrhoids.  The veins that supply the hemorrhoids will be tied off with a suture.  The hemorrhoids will be removed.  The tissue that surrounds the hemorrhoids will be closed with sutures that your body can absorb (absorbable sutures). ? Open hemorrhoidectomy.  The hemorrhoids will be removed with surgical instruments.  The incisions will be left open to heal without sutures. ? Stapled hemorrhoidectomy.  Your surgeon will use a circular stapling device to partially remove the hemorrhoids.  The device will be inserted into your anus. It will remove a circular ring of tissue that includes hemorrhoid tissue and some tissue above the hemorrhoids.  The staples in the device will close the edges of the tissue. This will cut off the blood supply to any remaining hemorrhoids and pull the tissue back into place. Each of these procedures may vary among health care providers and hospitals. What happens after the procedure?  Your blood pressure, heart rate, breathing rate, and blood oxygen level may be monitored until you leave the hospital or clinic.  You will be given pain medicine as needed.  Do not drive for 24 hours if you were given a sedative during your procedure. Summary  Surgery may be needed for hemorrhoids if diet changes, lifestyle changes, and  other treatments do not help your symptoms.  There are three common methods of surgery that are used to treat hemorrhoids.  Follow instructions from your health care provider about taking medicines and about eating and drinking before the procedure.  You may be instructed to take a laxative and an enema to clean out your colon before surgery (bowel prep). This information is not intended to replace advice given to you by your health care provider. Make sure you discuss any questions you have with your health care provider. Document Revised: 04/20/2019 Document Reviewed: 09/21/2018 Elsevier Patient Education  2021 [employer].

## 2020-12-12 NOTE — Progress Notes (Signed)
12/12/2020  History of Present Illness: Ashlee Campbell is a 40 y.o. female with a recent history of thrombosed external hemorrhoid, presenting for follow up.  She was seen on 1/5 as a referral after seeing her PCP on 11/13/20 for a thrombosed external hemorrhoid.  Today, she reports she's doing very well, without any further pain in the perianal area.  She does report still some swelling of the skin tissue, but otherwise, no other complaints.  She is wondering if the overlying skin tissue will shrink down completely again.  Past Medical History: Past Medical History:  Diagnosis Date  . Generalized headaches   . History of UTI      Past Surgical History: Past Surgical History:  Procedure Laterality Date  . CESAREAN SECTION  2002  . CESAREAN SECTION  2005  . CESAREAN SECTION  2009  . DILATION AND CURETTAGE, DIAGNOSTIC / THERAPEUTIC    . TUBAL LIGATION      Home Medications: Prior to Admission medications   Medication Sig Start Date End Date Taking? Authorizing Provider  Cholecalciferol (VITAMIN D-3) 125 MCG (5000 UT) TABS Take 1 tablet by mouth daily.   Yes [provider]  loratadine (CLARITIN) 10 MG tablet Take 10 mg by mouth daily.   Yes [provider]  Nutritional Supplements (NUTRITIONAL SUPPLEMENT PO) Take 1 tablet by mouth daily. Cranberry Fruit Extract 25,000 mg   Yes [provider]  Nutritional Supplements (NUTRITIONAL SUPPLEMENT PO) Take 300 mg by mouth daily. Goli Ashwagandha   Yes [provider]  NUTRITIONAL SUPPLEMENTS PO Take 2 each by mouth daily. Goli Web designer   Yes [provider]  Probiotic Product (PROBIOTIC PO) Take 2 tablets by mouth daily.   Yes [provider]    Allergies: Allergies  Allergen Reactions  . Penicillins Hives    Review of Systems: Review of Systems  Constitutional: Negative for chills and fever.  Respiratory: Negative for shortness of breath.   Cardiovascular: Negative  for chest pain.  Gastrointestinal: Negative for nausea and vomiting.  Skin: Negative for rash.    Physical Exam BP (!) 121/55   Pulse (!) 111   Temp 98.5 F (36.9 C) (Oral)   Ht 5' (1.524 m)   Wt 238 lb (108 kg)   LMP 11/25/2020   SpO2 96%   BMI 46.48 kg/m  CONSTITUTIONAL: No acute distress HEENT:  Normocephalic, atraumatic, extraocular motion intact. RESPIRATORY:  Normal respiratory effort without pathologic use of accessory muscles. CARDIOVASCULAR: Regular rhythm and rate. RECTAL:  External exam reveals a much improved left lateral thrombosed external hemorrhoid without any significant swelling and softer without any tenderness.  The right anterior external hemorrhoid skin is still enlarged, though without any swelling or tenderness.  Digital rectal exam reveals mildly enlarged internal components of the left lateral and right anterior columns. NEUROLOGIC:  Motor and sensation is grossly normal.  Cranial nerves are grossly intact. PSYCH:  Alert and oriented to person, place and time. Affect is normal.  Assessment and Plan: This is a 40 y.o. female with recent history of thrombosed external hemorrhoid.  --Discussed with the patient that indeed the left lateral external hemorrhoid is much improved.  No need for any procedure at this point.  Discussed with her that the overlying skin, particularly of the right anterior component, will most likely remain the same size without any further shrinking, leading to a "skin tag".  Although this could be excised, the area still has significant innervation with pain fibers, and there  would be pain associated with the surgery.  At this point, if the area is not bothering her, I would not push for excision.  She is in agreement and she does not want any surgery at this point either. --Recommended that she continue with bowel regimen to keep her bowel regular and stool soft without any significant straining.  Can do Sitz baths to help soothe any  enlarge tissue as well, and anusol/lidocaine during flareups.  She can contact us if there is another flare up so we can evaluate her and see if she needs any procedures. --Follow up as needed.  Face-to-face time spent with the patient and care providers was 15 minutes, with more than 50% of the time spent counseling, educating, and coordinating care of the patient.     Howie Ill, MD  [facility]

## 2021-11-13 ENCOUNTER — Other Ambulatory Visit: Payer: Self-pay

## 2021-11-13 ENCOUNTER — Emergency Department
Admission: EM | Admit: 2021-11-13 | Discharge: 2021-11-13 | Disposition: A | Discharge status: Home / Self Care | Payer: BC Managed Care – PPO | Attending: Emergency Medicine | Admitting: Emergency Medicine

## 2021-11-13 ENCOUNTER — Telehealth: Payer: Self-pay | Admitting: *Deleted

## 2021-11-13 ENCOUNTER — Encounter: Payer: Self-pay | Admitting: Emergency Medicine

## 2021-11-13 DIAGNOSIS — K648 Other hemorrhoids: Secondary | ICD-10-CM | POA: Diagnosis not present

## 2021-11-13 DIAGNOSIS — K625 Hemorrhage of anus and rectum: Secondary | ICD-10-CM

## 2021-11-13 LAB — TYPE AND SCREEN
ABO/RH(D): A POS
Antibody Screen: NEGATIVE

## 2021-11-13 LAB — COMPREHENSIVE METABOLIC PANEL
ALT: 25 U/L (ref 0–44)
AST: 24 U/L (ref 15–41)
Albumin: 3.7 g/dL (ref 3.5–5.0)
Alkaline Phosphatase: 93 U/L (ref 38–126)
Anion gap: 7 (ref 5–15)
BUN: 10 mg/dL (ref 6–20)
CO2: 27 mmol/L (ref 22–32)
Calcium: 8.9 mg/dL (ref 8.9–10.3)
Chloride: 102 mmol/L (ref 98–111)
Creatinine, Ser: 1.01 mg/dL — ABNORMAL HIGH (ref 0.44–1.00)
GFR, Estimated: >60 mL/min (ref >60)
Glucose, Bld: 111 mg/dL — ABNORMAL HIGH (ref 70–99)
Potassium: 3.6 mmol/L (ref 3.5–5.1)
Sodium: 136 mmol/L (ref 135–145)
Total Bilirubin: 0.5 mg/dL (ref 0.3–1.2)
Total Protein: 7.7 g/dL (ref 6.5–8.1)

## 2021-11-13 LAB — CBC
HCT: 37.3 % (ref 36.0–46.0)
Hemoglobin: 12.2 g/dL (ref 12.0–15.0)
MCH: 25.7 pg — ABNORMAL LOW (ref 26.0–34.0)
MCHC: 32.7 g/dL (ref 30.0–36.0)
MCV: 78.7 fL — ABNORMAL LOW (ref 80.0–100.0)
Platelets: 312 10*3/uL (ref 150–400)
RBC: 4.74 MIL/uL (ref 3.87–5.11)
RDW: 15.3 % (ref 11.5–15.5)
WBC: 9.8 10*3/uL (ref 4.0–10.5)
nRBC: 0 % (ref 0.0–0.2)

## 2021-11-13 LAB — PREGNANCY, URINE: Preg Test, Ur: NEGATIVE

## 2021-11-13 LAB — POC URINE PREG, ED: Preg Test, Ur: NEGATIVE

## 2021-11-13 NOTE — ED Provider Notes (Signed)
°  Emergency Medicine Provider Triage Evaluation Note  Ashlee Campbell , a 40 y.o.female,  was evaluated in triage.  Pt complains of rectal bleeding x 2 days.  Patient reports blood when wiping, as well as some clots mixed with stool.  Denies any other symptoms at this time.   Review of Systems  Positive: Rectal bleeding Negative: Denies fever, chest pain, vomiting  Physical Exam   Vitals:   11/13/21 1424  BP: (!) 143/85  Pulse: 97  Resp: 16  Temp: 98.1 F (36.7 C)  SpO2: 100%   Gen:   Awake, no distress   Resp:  Normal effort  MSK:   Moves extremities without difficulty  Other:    Medical Decision Making  Given the patient's initial medical screening exam, the following diagnostic evaluation has been ordered. The patient will be placed in the appropriate treatment space, once one is available, to complete the evaluation and treatment. I have discussed the plan of care with the patient and I have advised the patient that an ED physician or mid-level practitioner will reevaluate their condition after the test results have been received, as the results may give them additional insight into the type of treatment they may need.    Diagnostics: Labs  Treatments: none immediately   Varney Daily, [place] 11/13/21 1427    Delton Prairie, MD 11/13/21 [id number]

## 2021-11-13 NOTE — ED Triage Notes (Signed)
Presents with possible rectal bleeding

## 2021-11-13 NOTE — ED Provider Notes (Signed)
[facility] Emergency Department Provider Note   ____________________________________________   Event Date/Time   First MD Initiated Contact with Patient 11/13/21 1619     (approximate)  I have reviewed the triage vital signs and the nursing notes.   HISTORY  Chief Complaint Rectal Bleeding  HPI Ashlee Campbell is a 40 y.o. female who presents for rectal bleeding  LOCATION: Rectum DURATION: 48 hours prior to arrival TIMING: Stable since onset SEVERITY: Mild QUALITY: Rectal bleeding CONTEXT: Patient states that over the last 2 days she has had bright red blood mixed into her stools as well as with wiping and occasional blood clots.  Patient does endorse history of external hemorrhoids but has never carried the diagnosis of internal hemorrhoids.  Patient denies any history of diverticulosis as well MODIFYING FACTORS: Denies any exacerbating or relieving factors ASSOCIATED SYMPTOMS: Denies any abdominal pain, constipation, diarrhea, pain with defecation, tenesmus, or rectal trauma including receptive anal intercourse   Per medical record review, patient's history of external hemorrhoids, obesity, and GERD          Past Medical History:  Diagnosis Date   Generalized headaches    History of UTI     Patient Active Problem List   Diagnosis Date Noted   External hemorrhoid 11/13/2020   S/P tubal ligation 11/30/2019   Obesity, morbid, BMI 40.0-49.9 (HCC) 11/30/2019   G E R D 07/05/2007    Past Surgical History:  Procedure Laterality Date   CESAREAN SECTION  2002   CESAREAN SECTION  2005   CESAREAN SECTION  2009   DILATION AND CURETTAGE, DIAGNOSTIC / THERAPEUTIC     TUBAL LIGATION      Prior to Admission medications   Medication Sig Start Date End Date Taking? Authorizing Provider  Cholecalciferol (VITAMIN D-3) 125 MCG (5000 UT) TABS Take 1 tablet by mouth daily.    [provider]  loratadine (CLARITIN) 10 MG tablet Take 10 mg by  mouth daily.    [provider]  Nutritional Supplements (NUTRITIONAL SUPPLEMENT PO) Take 1 tablet by mouth daily. Cranberry Fruit Extract 25,000 mg    [provider]  Nutritional Supplements (NUTRITIONAL SUPPLEMENT PO) Take 300 mg by mouth daily. Goli Ashwagandha    [provider]  NUTRITIONAL SUPPLEMENTS PO Take 2 each by mouth daily. Goli Designer, fashion/clothing, Historical, MD  Probiotic Product (PROBIOTIC PO) Take 2 tablets by mouth daily.    [provider]    Allergies Penicillins  Family History  Problem Relation Age of Onset   Hypertension Mother    Arrhythmia Maternal Grandmother    Lung cancer Maternal Grandfather        smoker    Social History Social History   Tobacco Use   Smoking status: Never   Smokeless tobacco: Never  Vaping Use   Vaping Use: Never used  Substance Use Topics   Alcohol use: Yes    Comment: less than once a month   Drug use: Not Currently   Review of Systems Constitutional: No fever/chills Eyes: No visual changes. ENT: No sore throat. Cardiovascular: Denies chest pain. Respiratory: Denies shortness of breath. Gastrointestinal: No abdominal pain.  No nausea, no vomiting.  No diarrhea.  Endorses rectal bleeding Genitourinary: Negative for dysuria. Musculoskeletal: Negative for acute arthralgias Skin: Negative for rash. Neurological: Negative for headaches, weakness/numbness/paresthesias in any extremity Psychiatric: Negative for suicidal ideation/homicidal ideation ____________________________________________  PHYSICAL EXAM:  VITAL SIGNS: ED Triage Vitals  Enc Vitals Group  BP 11/13/21 1424 (!) 143/85     Pulse Rate 11/13/21 1424 97     Resp 11/13/21 1424 16     Temp 11/13/21 1424 98.1 F (36.7 C)     Temp Source 11/13/21 1424 Oral     SpO2 11/13/21 1424 100 %     Weight 11/13/21 1353 238 lb 1.6 oz (108 kg)     Height 11/13/21 1353 5' (1.524 m)     Head Circumference --       Peak Flow --      Pain Score 11/13/21 1622 0     Pain Loc --      Pain Edu? --      Excl. in [place]? --    Constitutional: Alert and oriented. Well appearing and in no acute distress. Eyes: Conjunctivae are normal. PERRL. Head: Atraumatic. Nose: No congestion/rhinnorhea. Mouth/Throat: Mucous membranes are moist. Neck: No stridor Cardiovascular: Grossly normal heart sounds.  Good peripheral circulation. Respiratory: Normal respiratory effort.  No retractions. Gastrointestinal: Soft and nontender. No distention. Rectal: Nonthrombosed external hemorrhoids that are not painful and not bleeding.  No dried blood at the rectum but can appreciate internal hemorrhoids.  No gross bleeding at this time Musculoskeletal: No obvious deformities Neurologic:  Normal speech and language. No gross focal neurologic deficits are appreciated. Skin:  Skin is warm and dry. No rash noted. Psychiatric: Mood and affect are normal. Speech and behavior are normal.  ____________________________________________   LABS (all labs ordered are listed, but only abnormal results are displayed)  Labs Reviewed  COMPREHENSIVE METABOLIC PANEL - Abnormal; Notable for the following components:      Result Value   Glucose, Bld 111 (*)    Creatinine, Ser 1.01 (*)    All other components within normal limits  CBC - Abnormal; Notable for the following components:   MCV 78.7 (*)    MCH 25.7 (*)    All other components within normal limits  PREGNANCY, URINE  POC URINE PREG, ED  TYPE AND SCREEN  PROCEDURES  Procedure(s) performed (including Critical Care):  Procedures   ____________________________________________   INITIAL IMPRESSION / ASSESSMENT AND PLAN / ED COURSE  As part of my medical decision making, I reviewed the following data within the electronic medical record, if available:  Nursing notes reviewed and incorporated, Labs reviewed, EKG interpreted, Old chart reviewed, Radiograph reviewed and Notes from  prior ED visits reviewed and incorporated      Given history and exam patients presentation most consistent with Lower GI bleed possibly secondary to hemorrhoid or other nonemergent cause of bleeding. I have low suspicion for Aortoenteric fistula, Upper GI Bleed, IBD, Mesenteric Ischemia, Rectal foreign body or ulcer.  Workup: CBC, CMP  Disposition: Discharge. Hemodynamically stable with no gross blood on rectal exam. SRP and prompt PCP follow up.  Also given referral for GI follow-up      ____________________________________________   FINAL CLINICAL IMPRESSION(S) / ED DIAGNOSES  Final diagnoses:  Rectal bleeding  Bleeding internal hemorrhoids     ED Discharge Orders     None        Note:  This document was prepared using Dragon voice recognition software and may include unintentional dictation errors.    Naaman Plummer, MD 11/13/21 [phone]

## 2021-11-13 NOTE — Telephone Encounter (Signed)
It looks like she is in the ER now  Will watch for correspondence

## 2021-11-13 NOTE — Telephone Encounter (Signed)
Spoke to patient by telephone and was advised that she is getting ready to head to the ER.

## 2021-11-13 NOTE — ED Notes (Signed)
Rectal bleeding x2 days , bright red blood , clots today , normal BM ccuring , hx of hemorrhoids, reports to outward hemorrhoids presently

## 2021-11-13 NOTE — Telephone Encounter (Signed)
PLEASE NOTE: All timestamps contained within this report are represented as [place] Standard Time. CONFIDENTIALTY NOTICE: This fax transmission is intended only for the addressee. It contains information that is legally privileged, confidential or otherwise protected from use or disclosure. If you are not the intended recipient, you are strictly prohibited from reviewing, disclosing, copying using or disseminating any of this information or taking any action in reliance on or regarding this information. If you have received this fax in error, please notify us immediately by telephone so that we can arrange for its return to [place]. Phone: [phone], Toll-Free: [phone], Fax: [phone] Page: 1 of 2 Call Id: [id number] [facility] Day - Client TELEPHONE ADVICE RECORD AccessNurse Patient Name: Ashlee Campbell Gender: Female DOB: 04-09-1981 Age: 40 Y 7 M 27 D Return Phone Number: [phone] (Primary), [phone] (Secondary) Address: [place]/ [place]/ [place] [address] Client [facility] Day - Client Client Site [facility] - Day Provider Gweneth Dimitri- MD Contact Type Call Who Is Calling Patient / Member / Family / Caregiver Call Type Triage / Clinical Relationship To Patient Self Return Phone Number [phone] (Primary) Chief Complaint Blood In Stool Reason for Call Symptomatic / Request for Health Information Initial Comment Caller states she wants to be seen today if possible. Has blood in her stool. Has had a clot when she wiped. Translation No Nurse Assessment Nurse: Michell Heinrich, RN, Lanora Manis Date/Time (Eastern Time): 11/13/2021 11:44:12 AM Confirm and document reason for call. If symptomatic, describe symptoms. ---Caller states that she has had some blood in her stool. States started yesterday morning. Does the patient have any new or worsening symptoms? ---Yes Will a triage be completed?  ---Yes Related visit to physician within the last 2 weeks? ---No Does the PT have any chronic conditions? (i.e. diabetes, asthma, this includes High risk factors for pregnancy, etc.) ---No Is the patient pregnant or possibly pregnant? (Ask all females between the ages of 53-55) ---No Is this a behavioral health or substance abuse call? ---No Guidelines Guideline Title Affirmed Question Affirmed Notes Nurse Date/Time (Eastern Time) Rectal Bleeding SEVERE rectal bleeding (large blood clots; constant or on and off bleeding) Cantrell, RN, Lanora Manis 11/13/2021 11:44:45 AM Disp. Time Lamount Cohen Time) Disposition Final User 11/13/2021 11:47:16 AM Go to ED Now Yes Cantrell, RN, Army Melia NOTE: All timestamps contained within this report are represented as [place] Standard Time. CONFIDENTIALTY NOTICE: This fax transmission is intended only for the addressee. It contains information that is legally privileged, confidential or otherwise protected from use or disclosure. If you are not the intended recipient, you are strictly prohibited from reviewing, disclosing, copying using or disseminating any of this information or taking any action in reliance on or regarding this information. If you have received this fax in error, please notify us immediately by telephone so that we can arrange for its return to [place]. Phone: [phone], Toll-Free: [phone], Fax: [phone] Page: 2 of 2 Call Id: [id number] Caller Disagree/Comply Comply Caller Understands Yes PreDisposition InappropriateToAsk Care Advice Given Per Guideline GO TO ED NOW: * You need to be seen in the Emergency Department. NOTE TO TRIAGER - DRIVING: * Another adult should drive. CARE ADVICE given per Rectal Bleeding (Adult) guideline. BRING MEDICINES: * Bring a list of your current medicines when you go to the Emergency Department (ER). * Bring the pill bottles too. This will help the doctor (or NP/PA) to make certain you are  taking the right medicines and the right dose.  Referrals [facility] - ED

## 2021-12-19 ENCOUNTER — Other Ambulatory Visit: Payer: Self-pay

## 2021-12-19 ENCOUNTER — Ambulatory Visit
Admission: RE | Admit: 2021-12-19 | Discharge: 2021-12-19 | Disposition: A | Discharge status: Home / Self Care | Payer: BC Managed Care – PPO | Source: Ambulatory Visit | Attending: Emergency Medicine | Admitting: Emergency Medicine

## 2021-12-19 VITALS — BP 136/78 | HR 116 | Temp 98.4°F | Resp 18

## 2021-12-19 DIAGNOSIS — J029 Acute pharyngitis, unspecified: Secondary | ICD-10-CM

## 2021-12-19 DIAGNOSIS — H6693 Otitis media, unspecified, bilateral: Secondary | ICD-10-CM

## 2021-12-19 DIAGNOSIS — R509 Fever, unspecified: Secondary | ICD-10-CM

## 2021-12-19 DIAGNOSIS — J0100 Acute maxillary sinusitis, unspecified: Secondary | ICD-10-CM

## 2021-12-19 LAB — POCT URINALYSIS DIP (MANUAL ENTRY)
Bilirubin, UA: NEGATIVE
Glucose, UA: NEGATIVE mg/dL
Ketones, POC UA: NEGATIVE mg/dL
Leukocytes, UA: NEGATIVE
Nitrite, UA: NEGATIVE
Protein Ur, POC: NEGATIVE mg/dL
Spec Grav, UA: 1.015 (ref 1.010–1.025)
Urobilinogen, UA: 1 E.U./dL
pH, UA: 6 (ref 5.0–8.0)

## 2021-12-19 LAB — POCT URINE PREGNANCY: Preg Test, Ur: NEGATIVE

## 2021-12-19 MED ORDER — DOXYCYCLINE HYCLATE 100 MG PO CAPS: 100.0000 mg | ORAL_CAPSULE | Freq: Two times a day (BID) | ORAL | 0 refills | Status: DC

## 2021-12-19 NOTE — Discharge Instructions (Addendum)
Take the doxycycline as directed.   Your COVID and Flu tests are pending.  You should self quarantine until the test results are back.    Take Tylenol or ibuprofen as needed for fever or discomfort.  Rest and keep yourself hydrated.    Follow-up with your primary care provider if your symptoms are not improving.

## 2021-12-19 NOTE — ED Provider Notes (Signed)
[facility]    CSN: [id number] Arrival date & time: 12/19/21  1100      History   Chief Complaint Chief Complaint  Patient presents with   Headache   Fever   Nasal Congestion   Dysuria    HPI Ashlee Campbell is a 41 y.o. female.  Patient presents with 5-day history of fever, headache, nasal congestion, sinus pressure.  T-max 103 at onset of symptoms; 101 yesterday.  She denies rash, ear pain, sore throat, cough, shortness of breath, vomiting, diarrhea, or other symptoms.  Treatment at home with Tylenol; last dose 2 hours ago.  She recently had a telemedicine visit for dysuria and finished taking Bactrim today. She denies current pregnancy or breastfeeding.     The history is provided by the patient and medical records.   Past Medical History:  Diagnosis Date   Generalized headaches    History of UTI     Patient Active Problem List   Diagnosis Date Noted   External hemorrhoid 11/13/2020   S/P tubal ligation 11/30/2019   Obesity, morbid, BMI 40.0-49.9 ([place]) 11/30/2019   G E R D 07/05/2007    Past Surgical History:  Procedure Laterality Date   CESAREAN SECTION  2002   CESAREAN SECTION  2005   CESAREAN SECTION  2009   DILATION AND CURETTAGE, DIAGNOSTIC / THERAPEUTIC     TUBAL LIGATION      OB History     Gravida  5   Para  1   Term  1   Preterm      AB  2   Living  3      SAB  1   IAB  1   Ectopic      Multiple      Live Births  3            Home Medications    Prior to Admission medications   Medication Sig Start Date End Date Taking? Authorizing Provider  doxycycline (VIBRAMYCIN) 100 MG capsule Take 1 capsule (100 mg total) by mouth 2 (two) times daily for 7 days. 12/19/21 12/26/21 Yes Sharion Balloon, NP  Cholecalciferol (VITAMIN D-3) 125 MCG (5000 UT) TABS Take 1 tablet by mouth daily.    [provider]  loratadine (CLARITIN) 10 MG tablet Take 10 mg by mouth daily.    [provider]  Nutritional Supplements  (NUTRITIONAL SUPPLEMENT PO) Take 1 tablet by mouth daily. Cranberry Fruit Extract 25,000 mg    [provider]  Nutritional Supplements (NUTRITIONAL SUPPLEMENT PO) Take 300 mg by mouth daily. Goli Ashwagandha    [provider]  NUTRITIONAL SUPPLEMENTS PO Take 2 each by mouth daily. Goli Sales promotion account executive, Historical, MD  Probiotic Product (PROBIOTIC PO) Take 2 tablets by mouth daily.    [provider]    Family History Family History  Problem Relation Age of Onset   Hypertension Mother    Arrhythmia Maternal Grandmother    Lung cancer Maternal Grandfather        smoker    Social History Social History   Tobacco Use   Smoking status: Never   Smokeless tobacco: Never  Vaping Use   Vaping Use: Never used  Substance Use Topics   Alcohol use: Yes    Comment: less than once a month   Drug use: Not Currently     Allergies   Penicillins   Review of Systems Review of Systems  Constitutional:  Positive for fever. Negative for chills.  HENT:  Positive for congestion, postnasal drip and sinus pressure. Negative for ear pain and sore throat.   Respiratory:  Negative for cough and shortness of breath.   Cardiovascular:  Negative for chest pain and palpitations.  Gastrointestinal:  Negative for abdominal pain, diarrhea and vomiting.  Skin:  Negative for color change and rash.  All other systems reviewed and are negative.   Physical Exam Triage Vital Signs ED Triage Vitals [12/19/21 1117]  Enc Vitals Group     BP      Pulse      Resp      Temp      Temp src      SpO2      Weight      Height      Head Circumference      Peak Flow      Pain Score 3     Pain Loc      Pain Edu?      Excl. in [place]?    No data found.  Updated Vital Signs BP 136/78    Pulse (!) 116    Temp 98.4 F (36.9 C)    Resp 18    SpO2 97%   Visual Acuity Right Eye Distance:   Left Eye Distance:   Bilateral Distance:    Right Eye Near:   Left Eye  Near:    Bilateral Near:     Physical Exam Vitals and nursing note reviewed.  Constitutional:      General: She is not in acute distress.    Appearance: She is well-developed. She is obese. She is not ill-appearing.  HENT:     Right Ear: Tympanic membrane is erythematous.     Left Ear: Tympanic membrane is erythematous.     Nose: Congestion present.     Mouth/Throat:     Mouth: Mucous membranes are moist.     Pharynx: Posterior oropharyngeal erythema present.  Cardiovascular:     Rate and Rhythm: Normal rate and regular rhythm.     Heart sounds: Normal heart sounds.  Pulmonary:     Effort: Pulmonary effort is normal. No respiratory distress.     Breath sounds: Normal breath sounds.  Abdominal:     General: Bowel sounds are normal.     Palpations: Abdomen is soft.     Tenderness: There is no abdominal tenderness. There is no right CVA tenderness, left CVA tenderness, guarding or rebound.  Musculoskeletal:     Cervical back: Neck supple.  Skin:    General: Skin is warm and dry.  Neurological:     Mental Status: She is alert.  Psychiatric:        Mood and Affect: Mood normal.        Behavior: Behavior normal.     UC Treatments / Results  Labs (all labs ordered are listed, but only abnormal results are displayed) Labs Reviewed  POCT URINALYSIS DIP (MANUAL ENTRY) - Abnormal; Notable for the following components:      Result Value   Blood, UA trace-intact (*)    All other components within normal limits  COVID-19, FLU A+B NAA  POCT URINE PREGNANCY    EKG   Radiology No results found.  Procedures Procedures (including critical care time)  Medications Ordered in UC Medications - No data to display  Initial Impression / Assessment and Plan / UC Course  I have reviewed the triage vital signs and the nursing notes.  Pertinent labs & imaging results that were available during my care of the patient were reviewed by me and considered in my medical decision making  (see chart for details).   Fever, sinusitis, otitis media, pharyngitis.  Patient completed 5 day course of Bactrim today; this was prescribed by telemedicine for UTI.  Urine does not indicate infection today.  Urine pregnancy negative.  Treating with doxycycline.  COVID and flu pending.  Instructed patient to self quarantine per CDC guidelines.  Discussed symptomatic treatment including Tylenol or ibuprofen, rest, hydration.  Instructed patient to follow up with PCP if symptoms are not improving.  Patient agrees to plan of care.    Final Clinical Impressions(s) / UC Diagnoses   Final diagnoses:  Fever, unspecified  Acute non-recurrent maxillary sinusitis  Bilateral otitis media, unspecified otitis media type  Acute pharyngitis, unspecified etiology     Discharge Instructions      Take the doxycycline as directed.   Your COVID and Flu tests are pending.  You should self quarantine until the test results are back.    Take Tylenol or ibuprofen as needed for fever or discomfort.  Rest and keep yourself hydrated.    Follow-up with your primary care provider if your symptoms are not improving.         ED Prescriptions     Medication Sig Dispense Auth. Provider   doxycycline (VIBRAMYCIN) 100 MG capsule Take 1 capsule (100 mg total) by mouth 2 (two) times daily for 7 days. 14 capsule Sharion Balloon, NP      PDMP not reviewed this encounter.   Sharion Balloon, NP 12/19/21 1149

## 2021-12-19 NOTE — ED Triage Notes (Signed)
Pt here with nasal congestion, fever, and headache x 5 days. Pt also just finished Bactrim for UTI, but still has sx of pressure and increased frequency.

## 2021-12-20 ENCOUNTER — Ambulatory Visit
Admission: EM | Admit: 2021-12-20 | Discharge: 2021-12-20 | Disposition: A | Discharge status: Home / Self Care | Payer: BC Managed Care – PPO

## 2021-12-20 ENCOUNTER — Other Ambulatory Visit: Payer: Self-pay

## 2021-12-20 DIAGNOSIS — R21 Rash and other nonspecific skin eruption: Secondary | ICD-10-CM | POA: Diagnosis not present

## 2021-12-20 DIAGNOSIS — L270 Generalized skin eruption due to drugs and medicaments taken internally: Secondary | ICD-10-CM | POA: Diagnosis not present

## 2021-12-20 DIAGNOSIS — T7840XA Allergy, unspecified, initial encounter: Secondary | ICD-10-CM | POA: Diagnosis not present

## 2021-12-20 MED ORDER — DEXAMETHASONE SODIUM PHOSPHATE 10 MG/ML IJ SOLN
10.0000 mg | Freq: Once | INTRAMUSCULAR | Status: AC
  Administered 2021-12-20: 10 mg via INTRAMUSCULAR

## 2021-12-20 MED ORDER — CETIRIZINE HCL 5 MG PO TABS: 5.0000 mg | ORAL_TABLET | Freq: Two times a day (BID) | ORAL | 0 refills | Status: DC

## 2021-12-20 MED ORDER — PREDNISONE 20 MG PO TABS: 20.0000 mg | ORAL_TABLET | Freq: Every day | ORAL | 0 refills | Status: AC

## 2021-12-20 NOTE — Discharge Instructions (Addendum)
Hold Loratadine while taking the following medications: Start Prednisone 20 mg tomorrow with breakfast and take daily for 5 days. Start cetrizine 5 mg twice daily tonight for itching and take twice daily for 7 days. Discard antibiotics.  Continue Tylenol for management of fever.  We will notify you once your COVID/Flu results are available.

## 2021-12-20 NOTE — ED Triage Notes (Signed)
Pt was prescribe doxycycline 100 mg BID, starting yesterday. Pt has taken a total of 3 doses,  1 hr PTA, noticed rash to bilateral arms, chest, abd, and face. Reports itching, denies burning sensation.   Pt denies difficulty swallowing, throat tightness, or difficulty speaking No SOB.   Pt took OTC Benadryl 25 mg 30 mins PTA.  Pt also was on bactirum, last dose was 2/1  Pt also reports vomiting that started today as well, vomited x1, 3 hrs PTA.

## 2021-12-20 NOTE — ED Provider Notes (Signed)
Roderic Palau    CSN: [id number] Arrival date & time: 12/20/21  1825      History   Chief Complaint Chief Complaint  Patient presents with   Allergic Reaction    HPI Ashlee Campbell is a 41 y.o. female.   HPI Patient present with acute onset pruritic generalized rash including face which developed today. She recently started Doxycyline yesterday and has completed 3 doses of the medication today.  Prior to starting doxycycline patient had completed a course of Bactrim the prior day for treatment of a urinary tract infection.  Patient has a penicillin allergy, however was unaware of any sensitivity to Doxycyline.  She endorses the rash developed approximately one hour and she took a benadryl. Denies any difficulty breathing, swallowing, or throat irritation. Last dose of Doxycyline this AM. She continues to have facial pressure, palpable lymph nodes neck, lower occipital region head and behind both ears. Fever today over 100 F. Currently afebrile after taking Tylenol earlier today.  She continues to feel chills and some mild body aches.  COVID flu test is pending from prior visit has yet to result. Past Medical History:  Diagnosis Date   Generalized headaches    History of UTI     Patient Active Problem List   Diagnosis Date Noted   External hemorrhoid 11/13/2020   S/P tubal ligation 11/30/2019   Obesity, morbid, BMI 40.0-49.9 ([place]) 11/30/2019   G E R D 07/05/2007    Past Surgical History:  Procedure Laterality Date   CESAREAN SECTION  2002   CESAREAN SECTION  2005   CESAREAN SECTION  2009   DILATION AND CURETTAGE, DIAGNOSTIC / THERAPEUTIC     TUBAL LIGATION      OB History     Gravida  5   Para  1   Term  1   Preterm      AB  2   Living  3      SAB  1   IAB  1   Ectopic      Multiple      Live Births  3            Home Medications    Prior to Admission medications   Medication Sig Start Date End Date Taking? Authorizing Provider   cetirizine (ZYRTEC) 5 MG tablet Take 1 tablet (5 mg total) by mouth 2 (two) times daily for 7 days. 12/20/21 12/27/21 Yes Scot Jun, FNP  Cholecalciferol (VITAMIN D-3) 125 MCG (5000 UT) TABS Take 1 tablet by mouth daily.   Yes [provider]  Multiple Vitamin (MULTIVITAMIN) tablet Take 2 tablets by mouth daily.   Yes [provider]  Nutritional Supplements (NUTRITIONAL SUPPLEMENT PO) Take 1 tablet by mouth daily. Cranberry Fruit Extract 25,000 mg   Yes [provider]  predniSONE (DELTASONE) 20 MG tablet Take 1 tablet (20 mg total) by mouth daily with breakfast for 5 days. 12/21/21 12/26/21 Yes Scot Jun, FNP  Probiotic Product (PROBIOTIC PO) Take 2 tablets by mouth daily.   Yes [provider]  loratadine (CLARITIN) 10 MG tablet Take 10 mg by mouth daily.    [provider]  Nutritional Supplements (NUTRITIONAL SUPPLEMENT PO) Take 300 mg by mouth daily. Goli Ashwagandha    [provider]  NUTRITIONAL SUPPLEMENTS PO Take 2 each by mouth daily. Goli Sales promotion account executive, Historical, MD    Family History Family History  Problem Relation Age of Onset  Hypertension Mother    Arrhythmia Maternal Grandmother    Lung cancer Maternal Grandfather        smoker    Social History Social History   Tobacco Use   Smoking status: Never   Smokeless tobacco: Never  Vaping Use   Vaping Use: Never used  Substance Use Topics   Alcohol use: Yes    Comment: less than once a month   Drug use: Not Currently     Allergies   Penicillins and Doxycycline   Review of Systems Review of Systems Pertinent negatives listed in HPI   Physical Exam Triage Vital Signs ED Triage Vitals  Enc Vitals Group     BP 12/20/21 1836 127/88     Pulse Rate 12/20/21 1836 87     Resp 12/20/21 1836 18     Temp 12/20/21 1836 98.7 F (37.1 C)     Temp Source 12/20/21 1836 Oral     SpO2 12/20/21 1836 96 %     Weight 12/20/21 1837 238  lb 1.6 oz (108 kg)     Height 12/20/21 1837 5' (1.524 m)     Head Circumference --      Peak Flow --      Pain Score 12/20/21 1837 0     Pain Loc --      Pain Edu? --      Excl. in [place]? --    No data found.  Updated Vital Signs BP 127/88 (BP Location: Right Arm)    Pulse 87    Temp 98.7 F (37.1 C) (Oral)    Resp 18    Ht 5' (1.524 m)    Wt 238 lb 1.6 oz (108 kg)    LMP 11/25/2021 (Approximate)    SpO2 96%    BMI 46.50 kg/m   Visual Acuity Right Eye Distance:   Left Eye Distance:   Bilateral Distance:    Right Eye Near:   Left Eye Near:    Bilateral Near:     Physical Exam Vitals reviewed.  Constitutional:      Appearance: Normal appearance.  HENT:     Head: Normocephalic.     Nose: Nose normal.  Eyes:     Extraocular Movements: Extraocular movements intact.     Pupils: Pupils are equal, round, and reactive to light.  Cardiovascular:     Rate and Rhythm: Normal rate and regular rhythm.  Pulmonary:     Effort: Pulmonary effort is normal.     Breath sounds: Normal breath sounds.  Musculoskeletal:        General: Normal range of motion.  Lymphadenopathy:     Cervical: No cervical adenopathy.  Skin:    Comments: Diffuse maculopapular fine rash generalize including face, eyes and mouth unaffected  Neurological:     General: No focal deficit present.     Mental Status: She is alert.  Psychiatric:        Mood and Affect: Mood normal.        Behavior: Behavior normal.        Thought Content: Thought content normal.        Judgment: Judgment normal.     UC Treatments / Results  Labs (all labs ordered are listed, but only abnormal results are displayed) Labs Reviewed - No data to display  EKG   Radiology No results found.  Procedures Procedures (including critical care time)  Medications Ordered in UC Medications  dexamethasone (DECADRON) injection 10 mg (10 mg Intramuscular Given 12/20/21  1904)    Initial Impression / Assessment and Plan / UC Course  I  have reviewed the triage vital signs and the nursing notes.  Pertinent labs & imaging results that were available during my care of the patient were reviewed by me and considered in my medical decision making (see chart for details).    Allergic reaction related to doxycycline is what I suspect today.   Decadron 10 mg administered here to stop reaction. She will continue management a home with prednisone 20 mg daily x 5 days. Cetrizine 5 mg BID x 7 days (hold Loratadine while taking cetrizine). Continue management of fever with tylenol and or ibuprofen. RTC as needed. Final Clinical Impressions(s) / UC Diagnoses   Final diagnoses:  Allergic reaction, initial encounter  Rash and nonspecific skin eruption  Allergic drug rash     Discharge Instructions      Hold Loratadine while taking the following medications: Start Prednisone 20 mg tomorrow with breakfast and take daily for 5 days. Start cetrizine 5 mg twice daily tonight for itching and take twice daily for 7 days. Discard antibiotics.  Continue Tylenol for management of fever.  We will notify you once your COVID/Flu results are available.     ED Prescriptions     Medication Sig Dispense Auth. Provider   predniSONE (DELTASONE) 20 MG tablet Take 1 tablet (20 mg total) by mouth daily with breakfast for 5 days. 5 tablet Scot Jun, FNP   cetirizine (ZYRTEC) 5 MG tablet Take 1 tablet (5 mg total) by mouth 2 (two) times daily for 7 days. 14 tablet Scot Jun, FNP      PDMP not reviewed this encounter.   Scot Jun, FNP 12/21/21 1501

## 2021-12-21 LAB — COVID-19, FLU A+B NAA
Influenza A, NAA: NOT DETECTED
Influenza B, NAA: NOT DETECTED
SARS-CoV-2, NAA: NOT DETECTED

## 2023-06-28 ENCOUNTER — Ambulatory Visit
Admission: RE | Admit: 2023-06-28 | Discharge: 2023-06-28 | Disposition: A | Discharge status: Home / Self Care | Payer: BC Managed Care – PPO | Source: Ambulatory Visit | Attending: Family Medicine | Admitting: Family Medicine

## 2023-06-28 VITALS — BP 130/87 | HR 94 | Temp 97.8°F | Resp 16

## 2023-06-28 DIAGNOSIS — B029 Zoster without complications: Secondary | ICD-10-CM

## 2023-06-28 NOTE — ED Provider Notes (Signed)
Ashlee Campbell    CSN: [id number] Arrival date & time: 06/28/23  1243      History   Chief Complaint Chief Complaint  Patient presents with   Rash    There is a small rash on my upper left back with minimal pain. - Entered by patient    HPI Ashlee Campbell is a 42 y.o. female.  Patient presents with 2-day history of mildly tender rash on her right upper back.  She had an e-visit and was diagnosed with shingles and prescribed Valtrex.  She has not started this medication yet as she wanted the diagnosis to be confirmed in person.  The rash started out as a nontender, nonpruritic papular area.  The papules have crusted over now and are mildly tender.  No other rash.  No fever or other symptoms.  The history is provided by the patient, the spouse and medical records.    Past Medical History:  Diagnosis Date   Generalized headaches    History of UTI     Patient Active Problem List   Diagnosis Date Noted   External hemorrhoid 11/13/2020   S/P tubal ligation 11/30/2019   Obesity, morbid, BMI 40.0-49.9 ([facility]) 11/30/2019   G E R D 07/05/2007    Past Surgical History:  Procedure Laterality Date   CESAREAN SECTION  2002   CESAREAN SECTION  2005   CESAREAN SECTION  2009   DILATION AND CURETTAGE, DIAGNOSTIC / THERAPEUTIC     TUBAL LIGATION      OB History     Gravida  5   Para  1   Term  1   Preterm      AB  2   Living  3      SAB  1   IAB  1   Ectopic      Multiple      Live Births  3            Home Medications    Prior to Admission medications   Medication Sig Start Date End Date Taking? Authorizing Provider  cetirizine (ZYRTEC) 5 MG tablet Take 1 tablet (5 mg total) by mouth 2 (two) times daily for 7 days. 12/20/21 12/27/21  Bing Neighbors, NP  Cholecalciferol (VITAMIN D-3) 125 MCG (5000 UT) TABS Take 1 tablet by mouth daily.    [provider]  loratadine (CLARITIN) 10 MG tablet Take 10 mg by mouth daily.    [provider]  Multiple Vitamin (MULTIVITAMIN) tablet Take 2 tablets by mouth daily.    [provider]  Nutritional Supplements (NUTRITIONAL SUPPLEMENT PO) Take 1 tablet by mouth daily. Cranberry Fruit Extract 25,000 mg    [provider]  Nutritional Supplements (NUTRITIONAL SUPPLEMENT PO) Take 300 mg by mouth daily. Goli Ashwagandha    [provider]  NUTRITIONAL SUPPLEMENTS PO Take 2 each by mouth daily. Goli Designer, fashion/clothing, Historical, MD  Probiotic Product (PROBIOTIC PO) Take 2 tablets by mouth daily.    [provider]    Family History Family History  Problem Relation Age of Onset   Hypertension Mother    Arrhythmia Maternal Grandmother    Lung cancer Maternal Grandfather        smoker    Social History Social History   Tobacco Use   Smoking status: Never   Smokeless tobacco: Never  Vaping Use   Vaping status: Never Used  Substance Use Topics   Alcohol use:  Yes    Comment: less than once a month   Drug use: Not Currently     Allergies   Penicillins, Amoxicillin, and Doxycycline   Review of Systems Review of Systems  Constitutional:  Negative for chills and fever.  Respiratory:  Negative for cough and shortness of breath.   Skin:  Positive for rash.  Neurological:  Negative for weakness and numbness.     Physical Exam Triage Vital Signs ED Triage Vitals  Encounter Vitals Group     BP 06/28/23 1255 130/87     Systolic BP Percentile --      Diastolic BP Percentile --      Pulse Rate 06/28/23 1255 94     Resp 06/28/23 1255 16     Temp 06/28/23 1255 97.8 F (36.6 C)     Temp Source 06/28/23 1255 Temporal     SpO2 06/28/23 1255 96 %     Weight --      Height --      Head Circumference --      Peak Flow --      Pain Score 06/28/23 1256 0     Pain Loc --      Pain Education --      Exclude from Growth Chart --    No data found.  Updated Vital Signs BP 130/87 (BP Location: Left Arm)   Pulse 94    Temp 97.8 F (36.6 C) (Temporal)   Resp 16   LMP 06/01/2023 (Approximate)   SpO2 96%   Visual Acuity Right Eye Distance:   Left Eye Distance:   Bilateral Distance:    Right Eye Near:   Left Eye Near:    Bilateral Near:     Physical Exam Vitals and nursing note reviewed.  Constitutional:      General: She is not in acute distress.    Appearance: She is well-developed.  HENT:     Head: Normocephalic and atraumatic.     Mouth/Throat:     Mouth: Mucous membranes are moist.  Cardiovascular:     Rate and Rhythm: Normal rate and regular rhythm.     Heart sounds: Normal heart sounds.  Pulmonary:     Effort: Pulmonary effort is normal. No respiratory distress.     Breath sounds: Normal breath sounds.  Musculoskeletal:     Cervical back: Neck supple.  Skin:    General: Skin is warm and dry.     Findings: Rash present.     Comments: 4 cm x 2 cm clustered patch of crusted lesions on right upper back.   Neurological:     Mental Status: She is alert.      UC Treatments / Results  Labs (all labs ordered are listed, but only abnormal results are displayed) Labs Reviewed - No data to display  EKG   Radiology No results found.  Procedures Procedures (including critical care time)  Medications Ordered in UC Medications - No data to display  Initial Impression / Assessment and Plan / UC Course  I have reviewed the triage vital signs and the nursing notes.  Pertinent labs & imaging results that were available during my care of the patient were reviewed by me and considered in my medical decision making (see chart for details).    Herpes zoster.  The rash does appear to be shingles.  Instructed patient to start the Valtrex as prescribed.  Education provided on shingles.  Instructed her to follow-up with her PCP.  She agrees  to plan of care.  Final Clinical Impressions(s) / UC Diagnoses   Final diagnoses:  Herpes zoster without complication     Discharge  Instructions      Take the Valtrex as prescribed.  See the attached information on shingles.  Follow-up with your primary care provider.     ED Prescriptions   None    PDMP not reviewed this encounter.   Mickie Bail, NP 06/28/23 1401

## 2023-06-28 NOTE — ED Triage Notes (Signed)
Patient presents to UC for rash on back x 2 days. Treating with benadryl.

## 2023-06-28 NOTE — Discharge Instructions (Addendum)
Take the Valtrex as prescribed.  See the attached information on shingles.  Follow-up with your primary care provider.

## 2023-07-17 ENCOUNTER — Ambulatory Visit: Payer: BC Managed Care – PPO | Admitting: Family Medicine

## 2023-07-17 VITALS — BP 124/82 | HR 96 | Temp 99.6°F | Ht 60.0 in | Wt 236.0 lb

## 2023-07-17 DIAGNOSIS — R590 Localized enlarged lymph nodes: Secondary | ICD-10-CM | POA: Diagnosis not present

## 2023-07-17 NOTE — Progress Notes (Signed)
Patient ID: Ashlee Campbell, female    DOB: [birth year], 42 y.o.   MRN: [id number]  This visit was conducted in person.  BP 124/82 (BP Location: Right Arm, Patient Position: Sitting, Cuff Size: Large)   Pulse 96   Temp 99.6 F (37.6 C) (Oral)   Ht 5' (1.524 m)   Wt 236 lb (107 kg)   LMP 06/01/2023 (Approximate)   BMI 46.09 kg/m    CC:  Chief Complaint  Patient presents with   Acute Visit    C/o "lump" behind L ear. States that issues started about 4 days.     Subjective:   HPI: Ashlee Campbell is a 42 y.o. female presenting on 07/17/2023 for Acute Visit (C/o "lump" behind L ear. States that issues started about 4 days. )   New onset lump  behind left ear x 3-4 days.  She first noted soreness... has gradually improved overtime.  No Ear pain, no ST,  mild congestion, some post nasal drip  No cough  No itchy eyes. No night sweats, no unexpected weight loss.   2 week ago she had a rash on upper back.Marland Kitchen dx with shingles   No fever,  no flu ike symptoms.    History of fall allergies.  Wt Readings from Last 3 Encounters:  07/17/23 236 lb (107 kg)  12/20/21 238 lb 1.6 oz (108 kg)  11/13/21 238 lb 1.6 oz (108 kg)        Relevant past medical, surgical, family and social history reviewed and updated as indicated. Interim medical history since our last visit reviewed. Allergies and medications reviewed and updated. Outpatient Medications Prior to Visit  Medication Sig Dispense Refill   cetirizine (ZYRTEC) 5 MG tablet Take 1 tablet (5 mg total) by mouth 2 (two) times daily for 7 days. 14 tablet 0   Cholecalciferol (VITAMIN D-3) 125 MCG (5000 UT) TABS Take 1 tablet by mouth daily.     loratadine (CLARITIN) 10 MG tablet Take 10 mg by mouth daily.     Multiple Vitamin (MULTIVITAMIN) tablet Take 2 tablets by mouth daily.     Nutritional Supplements (NUTRITIONAL SUPPLEMENT PO) Take 1 tablet by mouth daily. Cranberry Fruit Extract 25,000 mg     Nutritional Supplements  (NUTRITIONAL SUPPLEMENT PO) Take 300 mg by mouth daily. Goli Ashwagandha     NUTRITIONAL SUPPLEMENTS PO Take 2 each by mouth daily. Goli Apple Cider Vinegar     Probiotic Product (PROBIOTIC PO) Take 2 tablets by mouth daily.     No facility-administered medications prior to visit.     Per HPI unless specifically indicated in ROS section below Review of Systems  Constitutional:  Negative for fatigue and fever.  HENT:  Negative for congestion.   Eyes:  Negative for pain.  Respiratory:  Negative for cough and shortness of breath.   Cardiovascular:  Negative for chest pain, palpitations and leg swelling.  Gastrointestinal:  Negative for abdominal pain.  Genitourinary:  Negative for dysuria and vaginal bleeding.  Musculoskeletal:  Negative for back pain.  Neurological:  Negative for syncope, light-headedness and headaches.  Psychiatric/Behavioral:  Negative for dysphoric mood.    Objective:  BP 124/82 (BP Location: Right Arm, Patient Position: Sitting, Cuff Size: Large)   Pulse 96   Temp 99.6 F (37.6 C) (Oral)   Ht 5' (1.524 m)   Wt 236 lb (107 kg)   LMP 06/01/2023 (Approximate)   BMI 46.09 kg/m   Wt Readings from Last 3 Encounters:  07/17/23  236 lb (107 kg)  12/20/21 238 lb 1.6 oz (108 kg)  11/13/21 238 lb 1.6 oz (108 kg)      Physical Exam Constitutional:      General: She is not in acute distress.    Appearance: Normal appearance. She is well-developed. She is not ill-appearing or toxic-appearing.  HENT:     Head: Normocephalic.     Right Ear: Hearing, tympanic membrane, ear canal and external ear normal. Tympanic membrane is not erythematous, retracted or bulging.     Left Ear: Hearing, tympanic membrane, ear canal and external ear normal. Tympanic membrane is not erythematous, retracted or bulging.     Nose: No mucosal edema or rhinorrhea.     Right Sinus: No maxillary sinus tenderness or frontal sinus tenderness.     Left Sinus: No maxillary sinus tenderness or frontal  sinus tenderness.     Mouth/Throat:     Mouth: Oropharynx is clear and moist and mucous membranes are normal.     Pharynx: Uvula midline. Postnasal drip present.  Eyes:     General: Lids are normal. Lids are everted, no foreign bodies appreciated.     Extraocular Movements: EOM normal.     Conjunctiva/sclera: Conjunctivae normal.     Pupils: Pupils are equal, round, and reactive to light.  Neck:     Thyroid: No thyroid mass or thyromegaly.     Vascular: No carotid bruit.     Trachea: Trachea normal.  Cardiovascular:     Rate and Rhythm: Normal rate and regular rhythm.     Pulses: Normal pulses.     Heart sounds: Normal heart sounds, S1 normal and S2 normal. No murmur heard.    No friction rub. No gallop.  Pulmonary:     Effort: Pulmonary effort is normal. No tachypnea or respiratory distress.     Breath sounds: Normal breath sounds. No decreased breath sounds, wheezing, rhonchi or rales.  Abdominal:     General: Bowel sounds are normal.     Palpations: Abdomen is soft.     Tenderness: There is no abdominal tenderness.  Musculoskeletal:     Cervical back: Normal range of motion and neck supple.  Lymphadenopathy:     Head:     Right side of head: No submental, submandibular, tonsillar, posterior auricular or occipital adenopathy.     Left side of head: Posterior auricular adenopathy present. No submental, submandibular, tonsillar, preauricular or occipital adenopathy.     Cervical:     Right cervical: No superficial, deep or posterior cervical adenopathy.    Left cervical: No superficial, deep or posterior cervical adenopathy.     Upper Body:     Right upper body: No supraclavicular adenopathy.     Left upper body: No supraclavicular adenopathy.  Skin:    General: Skin is warm, dry and intact.     Findings: No rash.  Neurological:     Mental Status: She is alert.  Psychiatric:        Mood and Affect: Mood is not anxious or depressed.        Speech: Speech normal.         Behavior: Behavior normal. Behavior is cooperative.        Thought Content: Thought content normal.        Cognition and Memory: Cognition and memory normal.        Judgment: Judgment normal.       Results for orders placed or performed during the hospital encounter of 12/19/21  Covid-19, Flu A+B ([facility])   Specimen: Nasopharyngeal   Naso  Result Value Ref Range   SARS-CoV-2, NAA Not Detected Not Detected   Influenza A, NAA Not Detected Not Detected   Influenza B, NAA Not Detected Not Detected   Test Information: Comment   POCT urinalysis dipstick  Result Value Ref Range   Color, UA yellow yellow   Clarity, UA clear clear   Glucose, UA negative negative mg/dL   Bilirubin, UA negative negative   Ketones, POC UA negative negative mg/dL   Spec Grav, UA 9.563 8.756 - 1.025   Blood, UA trace-intact (A) negative   pH, UA 6.0 5.0 - 8.0   Protein Ur, POC negative negative mg/dL   Urobilinogen, UA 1.0 0.2 or 1.0 E.U./dL   Nitrite, UA Negative Negative   Leukocytes, UA Negative Negative  POCT urine pregnancy  Result Value Ref Range   Preg Test, Ur Negative Negative    Assessment and Plan  There are no diagnoses linked to this encounter.  No follow-ups on file.   Kerby Nora, MD

## 2023-07-17 NOTE — Patient Instructions (Signed)
Can use zyrtec for allergies if needed.  Treat with ibuprofen 600-800 mg every 8 hours ads needed for pain.  Call if not improving with time in 2 weeks for possible treatment with antibiotics.  Absolutely you are off that state for you just make a right okay take care you to she mention she established with [place] and is likely eminent but is her kids are my patient.  Will maybe she just Dr. Jessee Avers symptomatic knee

## 2023-07-17 NOTE — Assessment & Plan Note (Signed)
Acute, no clear known trigger.  Not clearly related to shingles flare on right upper back. Possibly related to mild allergy symptoms.  Can use Zyrtec or Flonase as needed for allergies. No clear sign of bacterial skin rash or ear infection external or medial. Treat with ibuprofen 600 to 800 mg 3 times a day as needed for pain and inflammation.  Call if not resolving in the next 2 weeks for consideration of antibiotics.  No additional lymphadenopathy noted.  No red flags

## 2023-08-18 ENCOUNTER — Encounter: Payer: BC Managed Care – PPO | Admitting: Family

## 2023-11-24 ENCOUNTER — Ambulatory Visit: Payer: BC Managed Care – PPO | Admitting: Family

## 2023-11-24 ENCOUNTER — Encounter: Payer: Self-pay | Admitting: Family

## 2023-11-24 VITALS — BP 124/84 | HR 84 | Temp 98.0°F | Ht 60.0 in

## 2023-11-24 DIAGNOSIS — Z87440 Personal history of urinary (tract) infections: Secondary | ICD-10-CM | POA: Diagnosis not present

## 2023-11-24 DIAGNOSIS — E559 Vitamin D deficiency, unspecified: Secondary | ICD-10-CM | POA: Diagnosis not present

## 2023-11-24 DIAGNOSIS — Z79899 Other long term (current) drug therapy: Secondary | ICD-10-CM

## 2023-11-24 DIAGNOSIS — K644 Residual hemorrhoidal skin tags: Secondary | ICD-10-CM

## 2023-11-24 DIAGNOSIS — J301 Allergic rhinitis due to pollen: Secondary | ICD-10-CM | POA: Diagnosis not present

## 2023-11-24 DIAGNOSIS — Z1159 Encounter for screening for other viral diseases: Secondary | ICD-10-CM | POA: Insufficient documentation

## 2023-11-24 DIAGNOSIS — N946 Dysmenorrhea, unspecified: Secondary | ICD-10-CM | POA: Insufficient documentation

## 2023-11-24 DIAGNOSIS — N920 Excessive and frequent menstruation with regular cycle: Secondary | ICD-10-CM

## 2023-11-24 DIAGNOSIS — Z0000 Encounter for general adult medical examination without abnormal findings: Secondary | ICD-10-CM | POA: Insufficient documentation

## 2023-11-24 DIAGNOSIS — Z862 Personal history of diseases of the blood and blood-forming organs and certain disorders involving the immune mechanism: Secondary | ICD-10-CM | POA: Insufficient documentation

## 2023-11-24 DIAGNOSIS — G43109 Migraine with aura, not intractable, without status migrainosus: Secondary | ICD-10-CM | POA: Insufficient documentation

## 2023-11-24 MED ORDER — LEVOCETIRIZINE DIHYDROCHLORIDE 5 MG PO TABS: 5.0000 mg | ORAL_TABLET | Freq: Every evening | ORAL | Status: AC

## 2023-11-24 MED ORDER — CRANBERRY FRUIT 465 MG PO CAPS: ORAL_CAPSULE | ORAL | Status: AC

## 2023-11-24 NOTE — Progress Notes (Signed)
 Subjective:  Patient ID: Ashlee Campbell, female    DOB: 26-Feb-1981  Age: 43 y.o. MRN: [id number]  Patient Care Team: Corwin Antu, FNP as PCP - General (Family Medicine)   CC:  Chief Complaint  Patient presents with   Establish Care    HPI Ashlee Campbell is a 43 y.o. female who presents today for an annual physical exam. She reports consuming a general diet. The patient does not participate in regular exercise at present. She generally feels well. She reports sleeping well. She does not have additional problems to discuss today.   Vision:Not within last year Dental:Receives regular dental care  Last pap: 11/30/2019, negative no history of positive paps per pt. Was seeing gynecology but didn't like it there.   Pt is without acute concerns.   Migraine with aura, only about once a quarter, associated nausea when they occur. Does get headaches which are more frequent once about every two weeks. Lasts for a few days, excedrin does help. Has increased water intake.   H/o iron def anemia: craving ice so feels like she might be. She did eat corn starch in the past when she had low iron.   Menses: regular monthly, heavy periods. Painful periods. Symptoms have worsened the older that she seems to get . Has been getting heavier as well, having to use a tampon and a pad. Left lower back pain, constant, worse with movement at times, has worked on stretching without much relief. Worse during periods.   Advanced Directives Patient does not have advanced directives   DEPRESSION SCREENING    11/24/2023   11:02 AM 11/13/2020    2:26 PM  PHQ 2/9 Scores  PHQ - 2 Score 0 0  PHQ- 9 Score 3      ROS: Negative unless specifically indicated above in HPI.    Current Outpatient Medications:    Cholecalciferol (VITAMIN D -3) 125 MCG (5000 UT) TABS, Take 1 tablet by mouth daily., Disp: , Rfl:    Cranberry Fruit 465 MG CAPS, Take one po every day, Disp: , Rfl:    levocetirizine (XYZAL ) 5 MG  tablet, Take 1 tablet (5 mg total) by mouth every evening., Disp: , Rfl:    Multiple Vitamin (MULTIVITAMIN) tablet, Take 2 tablets by mouth daily., Disp: , Rfl:    Probiotic Product (PROBIOTIC PO), Take 2 tablets by mouth daily., Disp: , Rfl:     Objective:    BP 124/84 (BP Location: Left Arm, Patient Position: Sitting, Cuff Size: Large)   Pulse 84   Temp 98 F (36.7 C) (Temporal)   Ht 5' (1.524 m)   LMP 11/17/2023   SpO2 98%   BMI 46.09 kg/m   BP Readings from Last 3 Encounters:  11/24/23 124/84  07/17/23 124/82  06/28/23 130/87      Physical Exam Constitutional:      General: She is not in acute distress.    Appearance: Normal appearance. She is obese. She is not ill-appearing.  HENT:     Head: Normocephalic.     Right Ear: Tympanic membrane normal.     Left Ear: Tympanic membrane normal.     Nose: Nose normal.     Mouth/Throat:     Mouth: Mucous membranes are moist.  Eyes:     Extraocular Movements: Extraocular movements intact.     Pupils: Pupils are equal, round, and reactive to light.  Cardiovascular:     Rate and Rhythm: Normal rate and regular rhythm.  Pulmonary:  Effort: Pulmonary effort is normal.     Breath sounds: Normal breath sounds.  Abdominal:     General: Abdomen is flat. Bowel sounds are normal.     Palpations: Abdomen is soft.     Tenderness: There is abdominal tenderness in the left lower quadrant. There is no guarding or rebound.     Hernia: No hernia is present.  Musculoskeletal:        General: Normal range of motion.     Cervical back: Normal range of motion.     Lumbar back: Tenderness (palpable left lower back pain) present.  Skin:    General: Skin is warm.     Capillary Refill: Capillary refill takes less than 2 seconds.  Neurological:     General: No focal deficit present.     Mental Status: She is alert.  Psychiatric:        Mood and Affect: Mood normal.        Behavior: Behavior normal.        Thought Content: Thought  content normal.        Judgment: Judgment normal.          Assessment & Plan:  Seasonal allergic rhinitis due to pollen Assessment & Plan: Stable with xyzal   Orders: -     Levocetirizine Dihydrochloride ; Take 1 tablet (5 mg total) by mouth every evening.  History of recurrent urinary tract infection Assessment & Plan: Continue cranberry   Orders: -     Cranberry Fruit; Take one po every day  Vitamin D  deficiency Assessment & Plan: Ordered vitamin d  pending results.    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    Obesity, morbid, BMI 40.0-49.9 (HCC) Assessment & Plan: Pt advised to work on diet and exercise as tolerated    Encounter for general adult medical examination without abnormal findings Assessment & Plan: Patient Counseling(The following topics were reviewed):  Preventative care handout given to pt  Health maintenance and immunizations reviewed. Please refer to Health maintenance section. Pt advised on safe sex, wearing seatbelts in car, and proper nutrition labwork ordered today for annual Dental health: Discussed importance of regular tooth brushing, flossing, and dental visits.    Orders: -     Cranberry Fruit; Take one po every day -     Levocetirizine Dihydrochloride ; Take 1 tablet (5 mg total) by mouth every evening. -     CBC with Differential/Platelet -     TSH -     Basic metabolic panel  History of iron deficiency anemia -     CBC with Differential/Platelet -     Iron and TIBC -     Ferritin  On statin therapy -     Lipid panel  Encounter for hepatitis C screening test for low risk patient -     Hepatitis C antibody  Menorrhagia  with regular cycle -     Follicle stimulating hormone -     Prolactin -     US  PELVIC COMPLETE WITH TRANSVAGINAL; Future -     Testosterone   Painful menstrual periods Assessment & Plan: Workup for PCOS  Suspected uterine fibroids vs ovarian cyst Also suspect left lower back pain part of  this.  Could consider endometriosis as well Will discuss if needed OCP vs metformin based on results and or gyn consult. Suspect contributing to anemia and PICA symptoms, pending cbc ibc ferritin results as well.  For now heating pad and tylenol/ibuprofen prn  Orders: -     US  PELVIC COMPLETE WITH TRANSVAGINAL; Future      Follow-up: Return in about 1 year (around 11/23/2024) for f/u CPE.   Ginger Patrick, FNP

## 2023-11-24 NOTE — Assessment & Plan Note (Signed)
 Workup for PCOS  Suspected uterine fibroids vs ovarian cyst Also suspect left lower back pain part of this.  Could consider endometriosis as well Will discuss if needed OCP vs metformin based on results and or gyn consult. Suspect contributing to anemia and PICA symptoms, pending cbc ibc ferritin results as well.  For now heating pad and tylenol/ibuprofen prn

## 2023-11-24 NOTE — Assessment & Plan Note (Signed)

## 2023-11-24 NOTE — Assessment & Plan Note (Signed)
 Ordered vitamin d pending results.

## 2023-11-24 NOTE — Assessment & Plan Note (Signed)
 Recommendations for otc riboflavin and magnesium  Excedrin prn  Pt will let me know if worsening and or no improvement Keep staying hydrated with water.

## 2023-11-24 NOTE — Assessment & Plan Note (Signed)
 Pt advised to work on diet and exercise as tolerated

## 2023-11-24 NOTE — Assessment & Plan Note (Signed)
 stable

## 2023-11-24 NOTE — Assessment & Plan Note (Signed)
 Continue cranberry

## 2023-11-24 NOTE — Assessment & Plan Note (Signed)
 Stable with xyzal

## 2023-11-24 NOTE — Patient Instructions (Addendum)
------------------------------------   I have sent in your order electronically for the following: ultrasound transvaginal  at this location below. Please call to schedule the appointment at your convenience  [facility] off [address], [place] [address] Phone [phone]-  8-5 pm   ------------------------------------  MIGRAINE RECOMMENDATIONS TO CONSIDER  1. Limit use of pain relievers to no more than 2 days out of the week.  These medications include acetaminophen, NSAIDs (ibuprofen/Advil/Motrin, naproxen/Aleve, triptans (Imitrex/sumatriptan), Excedrin, and narcotics.  This will help reduce risk of rebound headaches. 2. Be aware of common food triggers:             - Caffeine:  coffee, black tea, cola, Mt. Dew             - Chocolate             - Dairy:  aged cheeses (brie, blue, cheddar, gouda, Parmasan, provolone, romano, Swiss, etc), chocolate milk, buttermilk, sour cream, limit eggs and yogurt             - Nuts, peanut butter             - Alcohol             - Cereals/grains:  FRESH breads (fresh bagels, sourdough, doughnuts), yeast productions             - Processed/canned/aged/cured meats (pre-packaged deli meats, hotdogs)             - MSG/glutamate:  soy sauce, flavor enhancer, pickled/preserved/marinated foods             - Sweeteners:  aspartame (Equal, Nutrasweet).  Sugar and Splenda are okay             - Vegetables:  legumes (lima beans, lentils, snow peas, fava beans, pinto peans, peas, garbanzo beans), sauerkraut, onions, olives, pickles             - Fruit:  avocados, bananas, citrus fruit (orange, lemon, grapefruit), mango             - Other:  Frozen meals, macaroni and cheese 7. Routine exercise 8. Stay adequately hydrated (aim for 64 oz water daily) 9. Keep headache diary 10. Maintain proper stress management 11. Maintain proper sleep hygiene 12. Do not skip meals 13. Consider supplements:  magnesium  citrate 400mg  daily, riboflavin 400mg  daily, coenzyme Q10 100mg  three times daily.

## 2023-11-25 ENCOUNTER — Encounter: Payer: Self-pay | Admitting: Family

## 2023-11-25 LAB — CBC WITH DIFFERENTIAL/PLATELET
Basophils Absolute: 0.1 10*3/uL (ref 0.0–0.2)
Basos: 1 %
EOS (ABSOLUTE): 0.3 10*3/uL (ref 0.0–0.4)
Eos: 4 %
Hematocrit: 38.3 % (ref 34.0–46.6)
Hemoglobin: 11.9 g/dL (ref 11.1–15.9)
Immature Grans (Abs): 0 10*3/uL (ref 0.0–0.1)
Immature Granulocytes: 0 %
Lymphocytes Absolute: 2.1 10*3/uL (ref 0.7–3.1)
Lymphs: 22 %
MCH: 24.4 pg — ABNORMAL LOW (ref 26.6–33.0)
MCHC: 31.1 g/dL — ABNORMAL LOW (ref 31.5–35.7)
MCV: 79 fL (ref 79–97)
Monocytes Absolute: 0.5 10*3/uL (ref 0.1–0.9)
Monocytes: 5 %
Neutrophils Absolute: 6.7 10*3/uL (ref 1.4–7.0)
Neutrophils: 68 %
Platelets: 370 10*3/uL (ref 150–450)
RBC: 4.88 x10E6/uL (ref 3.77–5.28)
RDW: 16 % — ABNORMAL HIGH (ref 11.7–15.4)
WBC: 9.8 10*3/uL (ref 3.4–10.8)

## 2023-11-25 LAB — BASIC METABOLIC PANEL
BUN/Creatinine Ratio: 10 (ref 9–23)
BUN: 11 mg/dL (ref 6–24)
CO2: 26 mmol/L (ref 20–29)
Calcium: 9.2 mg/dL (ref 8.7–10.2)
Chloride: 102 mmol/L (ref 96–106)
Creatinine, Ser: 1.1 mg/dL — ABNORMAL HIGH (ref 0.57–1.00)
Glucose: 81 mg/dL (ref 70–99)
Potassium: 4 mmol/L (ref 3.5–5.2)
Sodium: 141 mmol/L (ref 134–144)
eGFR: 64 mL/min/{1.73_m2} (ref >59)

## 2023-11-25 LAB — IRON AND TIBC
Iron Saturation: 10 % — ABNORMAL LOW (ref 15–55)
Iron: 37 ug/dL (ref 27–159)
Total Iron Binding Capacity: 358 ug/dL (ref 250–450)
UIBC: 321 ug/dL (ref 131–425)

## 2023-11-25 LAB — FOLLICLE STIMULATING HORMONE: FSH: 4.5 m[IU]/mL

## 2023-11-25 LAB — PROLACTIN: Prolactin: 17.5 ng/mL (ref 4.8–33.4)

## 2023-11-25 LAB — LIPID PANEL
Chol/HDL Ratio: 3.2 {ratio} (ref 0.0–4.4)
Cholesterol, Total: 198 mg/dL (ref 100–199)
HDL: 61 mg/dL (ref >39)
LDL Chol Calc (NIH): 119 mg/dL — ABNORMAL HIGH (ref 0–99)
Triglycerides: 103 mg/dL (ref 0–149)
VLDL Cholesterol Cal: 18 mg/dL (ref 5–40)

## 2023-11-25 LAB — TSH: TSH: 0.743 u[IU]/mL (ref 0.450–4.500)

## 2023-11-25 LAB — VITAMIN D 25 HYDROXY (VIT D DEFICIENCY, FRACTURES): Vit D, 25-Hydroxy: 93.7 ng/mL (ref 30.0–100.0)

## 2023-11-25 LAB — FERRITIN: Ferritin: 20 ng/mL (ref 15–150)

## 2023-11-25 LAB — TESTOSTERONE: Testosterone: 30 ng/dL (ref 4–50)

## 2023-11-25 LAB — HEPATITIS C ANTIBODY: Hep C Virus Ab: NONREACTIVE

## 2023-12-02 ENCOUNTER — Ambulatory Visit
Admission: RE | Admit: 2023-12-02 | Discharge: 2023-12-02 | Disposition: A | Discharge status: Home / Self Care | Payer: BC Managed Care – PPO | Source: Ambulatory Visit | Attending: Family | Admitting: Family

## 2023-12-02 DIAGNOSIS — N946 Dysmenorrhea, unspecified: Secondary | ICD-10-CM | POA: Insufficient documentation

## 2023-12-02 DIAGNOSIS — N920 Excessive and frequent menstruation with regular cycle: Secondary | ICD-10-CM | POA: Diagnosis present

## 2023-12-08 ENCOUNTER — Encounter: Payer: Self-pay | Admitting: Family

## 2023-12-08 DIAGNOSIS — R935 Abnormal findings on diagnostic imaging of other abdominal regions, including retroperitoneum: Secondary | ICD-10-CM

## 2023-12-08 DIAGNOSIS — N946 Dysmenorrhea, unspecified: Secondary | ICD-10-CM

## 2023-12-18 ENCOUNTER — Telehealth: Payer: Self-pay | Admitting: Family

## 2023-12-18 NOTE — Telephone Encounter (Signed)
Copied from CRM [id number]. Topic: Referral - Question >> Dec 18, 2023 11:01 AM Fredrich Romans wrote: Reason for CRM: Obgyn where referral was sent out for patient isn't able to get her in until April.She would like to know If Ashlee Campbell could send a referral to another OBGYN that may be able to get her in sooner?

## 2023-12-18 NOTE — Telephone Encounter (Signed)
Spoke with and she states that [facility] can't see her until April. Pt is requesting Tabitha to put in a new referral to an office in [place] to see if they can see her before then.

## 2023-12-20 ENCOUNTER — Encounter: Payer: Self-pay | Admitting: Family

## 2024-01-05 ENCOUNTER — Encounter: Payer: Self-pay | Admitting: Obstetrics and Gynecology

## 2024-01-05 ENCOUNTER — Ambulatory Visit: Payer: BC Managed Care – PPO | Admitting: Obstetrics and Gynecology

## 2024-01-05 VITALS — BP 134/83 | HR 109 | Wt 243.4 lb

## 2024-01-05 DIAGNOSIS — N920 Excessive and frequent menstruation with regular cycle: Secondary | ICD-10-CM | POA: Diagnosis not present

## 2024-01-05 DIAGNOSIS — N946 Dysmenorrhea, unspecified: Secondary | ICD-10-CM

## 2024-01-05 MED ORDER — TRANEXAMIC ACID 650 MG PO TABS: 1300.0000 mg | ORAL_TABLET | Freq: Three times a day (TID) | ORAL | 2 refills | Status: DC

## 2024-01-05 NOTE — Progress Notes (Signed)
Obstetrics and Gynecology New Patient Evaluation  Appointment Date: 01/05/2024  OBGYN Clinic: [facility]  Primary Care Provider: Mort Sawyers  Referring Provider: Mort Sawyers, FNP  Chief Complaint:  Chief Complaint  Patient presents with   New GYN   Heavy and painful periods  History of Present Illness: Ashlee Campbell is a 43 y.o.  [phone] (Patient's last menstrual period was 12/14/2023 (approximate).), seen for the above chief complaint. Her past medical history is significant for h/o c/s x 3 with BTL for the last one, BMI 40s  Patient with s/s for years but worsened in the past 1-2 years. Hasn't tried anything for period control except she does do NSAIDs. Has been on any hormonal birth control.    Patient referred by PCP, who set up referral, and had negative labs and u/s in January (see below)  Review of Systems: Pertinent items are noted in HPI.   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 11/24/2023   History of iron deficiency anemia 11/24/2023   Painful menstrual periods 11/24/2023   External hemorrhoid 11/13/2020   Obesity, morbid, BMI 40.0-49.9 (HCC) 11/30/2019    Past Medical History:  Past Medical History:  Diagnosis Date   Generalized headaches    History of UTI    Past Surgical History:  Past Surgical History:  Procedure Laterality Date   CESAREAN SECTION  2002   CESAREAN SECTION  2005   CESAREAN SECTION WITH BILATERAL TUBAL LIGATION  2009   DILATION AND CURETTAGE, DIAGNOSTIC / THERAPEUTIC  06/2009   DILATION AND CURETTAGE, DIAGNOSTIC / THERAPEUTIC  04/2009    Past Obstetrical History:  OB History  Gravida Para Term Preterm AB Living  6 1 1  3 3   SAB IAB Ectopic Multiple Live Births  2 1   3     # Outcome  Date GA Lbr Len/2nd Weight Sex Type Anes PTL Lv  6 SAB 2010          5 SAB 2010          4 Gravida 2009    M CS-LTranv   LIV  3 Gravida 2005    M CS-LTranv   LIV  2 Term 2002   9 lb 5 oz (4.224 kg) F CS-LTranv   LIV  1 IAB            Past Gynecological History: As per HPI. Periods: qmonth, regular, 7-10 days, heavy and painful History of Pap Smear(s): Yes.   Last pap 2021, which was negative cytology and HPV  Social History:  Social History   Socioeconomic History   Marital status: Married    Spouse name: Peyton Najjar   Number of children: 3   Years of education: high school   Highest education level: 12th grade  Occupational History   Not on file  Tobacco Use   Smoking status: Never   Smokeless tobacco: Never  Vaping Use   Vaping status: Never Used  Substance and Sexual Activity   Alcohol use: Yes    Comment: less than once a month   Drug use: Never   Sexual activity: Yes    Partners: Male    Birth control/protection: Surgical  Other Topics Concern   Not on file  Social History Narrative   11/13/20   From:  the area   Living: with husband, Peyton Najjar (2001) - 3 children   Work: Merchant navy officer       Family: 3 children -  Proofreader (2002), Denny Peon (2005), Kellie Shropshire (2009)      Enjoys: read, spend time with family      Exercise: not currently   Diet: varied diet      Safety   Seat belts: Yes    Guns: No   Safe in relationships: Yes    Social Drivers of Corporate investment banker Strain: Low Risk  (07/17/2023)   Overall Financial Resource Strain (CARDIA)    Difficulty of Paying Living Expenses: Not very hard  Food Insecurity: Patient Declined (07/17/2023)   Hunger Vital Sign    Worried About Running Out of Food in the Last Year: Patient declined    Ran Out of Food in the Last Year: Patient declined  Transportation Needs: No Transportation Needs (07/17/2023)   PRAPARE - Administrator, Civil Service (Medical): No    Lack of Transportation (Non-Medical): No   Physical Activity: Unknown (07/17/2023)   Exercise Vital Sign    Days of Exercise per Week: Patient declined    Minutes of Exercise per Session: Not on file  Stress: Stress Concern Present (07/17/2023)   [employer] of Occupational Health - Occupational Stress Questionnaire    Feeling of Stress : Rather much  Social Connections: Moderately Isolated (07/17/2023)   Social Connection and Isolation Panel [NHANES]    Frequency of Communication with Friends and Family: More than three times a week    Frequency of Social Gatherings with Friends and Family: Once a week    Attends Religious Services: Never    Database administrator or Organizations: No    Attends Engineer, structural: Not on file    Marital Status: Married  Catering manager Violence: Not on file    Family History:  Family History  Problem Relation Age of Onset   Hypertension Mother    Atrial fibrillation Maternal Grandmother    Lung cancer Maternal Grandfather        smoker   Medications Susa D. Gornick "Daishia Koroma" had no medications administered during this visit. Current Outpatient Medications  Medication Sig Dispense Refill   Cholecalciferol (VITAMIN D-3) 125 MCG (5000 UT) TABS Take 1 tablet by mouth daily.     Cranberry Fruit 465 MG CAPS Take one po every day     levocetirizine (XYZAL) 5 MG tablet Take 1 tablet (5 mg total) by mouth every evening.     Multiple Vitamin (MULTIVITAMIN) tablet Take 2 tablets by mouth daily.     Probiotic Product (PROBIOTIC PO) Take 2 tablets by mouth daily.     tranexamic acid (LYSTEDA) 650 MG TABS tablet Take 2 tablets (1,300 mg total) by mouth 3 (three) times daily. Take during menses for a maximum of five days 30 tablet 2   No current facility-administered medications for this visit.   Allergies Penicillins and Doxycycline  Physical Exam:  BP 134/83   Pulse (!) 109   Wt 243 lb 6.4 oz (110.4 kg)   LMP 12/14/2023 (Approximate)   BMI 47.54 kg/m  Body mass  index is 47.54 kg/m. General appearance: Well nourished, well developed female in no acute distress.  Cardiovascular: normal s1 and s2.  No murmurs, rubs or gallops. Respiratory:  Clear to auscultation bilateral. Normal respiratory effort Abdomen: positive bowel sounds and no masses, hernias; diffusely non tender to palpation, non distended Neuro/Psych:  Normal mood and affect.  Skin:  Warm and dry.  Lymphatic:  No inguinal lymphadenopathy.   Cervical exam performed in the presence of a chaperone Pelvic exam: is limited by body habitus EGBUS: within normal limits Vagina: within normal limits and with no blood or discharge in the vault Cervix: normal appearing cervix without tenderness, discharge or lesions. Uterus:  nonenlarged and non tender, mobile Adnexa:  normal adnexa and no mass, fullness, tenderness Rectovaginal: deferred  Laboratory: January 2025 negative testosterone, prl, fsh, hep c ab, tsh, cbc, vitamin d. Bmp with Cr 1.10, LDL 119  Radiology: Narrative & Impression  : PROCEDURE: US PELVIS COMPLETE WITH TRANSVAGINAL   HISTORY: Patient is a 43 y/o F with heavy periods, painful periods for two years. LMP 11/17/23. History of 3 c-sections.   COMPARISON: US Pelvis 06/15/09.   TECHNIQUE: Two-dimensional transabdominal grayscale and color Doppler ultrasound of the pelvis was performed. Transvaginal was also performed.   FINDINGS: The uterus is anteverted in position and measures 9.0 x 7.1 x 5.8 cm. It demonstrates a heterogeneous echotexture without definite fibroid. The endometrium measures 0.4 cm and demonstrates a normal homogeneous echotexture.   The right ovary is not visualized.   The left ovary measures 4.2 x 2.4 x 2.2 cm and demonstrates a normal echotexture. There is normal color Doppler flow.   There is no fluid present within the cul-de-sac.   IMPRESSION: 1.  Heterogeneous uterine echotexture without definite fibroid.   2.  Nonvisualization of the  right ovary.   Thank you for allowing [place] to assist in the care of this patient.     Electronically Signed   By: Lestine Box M.D.   On: 12/02/2023 19:52    Assessment: patient stable  Plan:  1. Dysmenorrhea (Primary) Images reviewed D/w her re: various options including Lysteda, Mirena, POPs, depo provera, nexplanon as well as surgery with ablation and hysterectomy discussed. I also d/w her re: expectant management given normal labs. I told her I wouldn't recommend a hyst at this point, and she is in agreement with this. Patient would like to try Lysteda. RTC 3 months   2. Menorrhagia with regular cycle  Return in about 3 months (around 04/03/2024) for in person, with dr Vergie Living.  No future appointments.  Cornelia Copa MD Attending [facility])

## 2024-01-05 NOTE — Progress Notes (Signed)
CC: Heavy bleeding with clots (bigger than quarter sized), increase pain in bilateral legs, PCP ordered US   Gotten worse over the years  Legs and back pain x May 2024   Tubal 2009  D & C 2010

## 2024-02-15 ENCOUNTER — Encounter: Payer: BC Managed Care – PPO | Admitting: Obstetrics and Gynecology

## 2024-03-29 ENCOUNTER — Encounter: Payer: Self-pay | Admitting: Family

## 2024-04-04 ENCOUNTER — Ambulatory Visit: Admitting: Obstetrics and Gynecology

## 2024-04-04 VITALS — BP 159/94 | HR 106 | Wt 246.0 lb

## 2024-04-04 DIAGNOSIS — N920 Excessive and frequent menstruation with regular cycle: Secondary | ICD-10-CM | POA: Diagnosis not present

## 2024-04-04 DIAGNOSIS — R030 Elevated blood-pressure reading, without diagnosis of hypertension: Secondary | ICD-10-CM | POA: Diagnosis not present

## 2024-04-04 DIAGNOSIS — N946 Dysmenorrhea, unspecified: Secondary | ICD-10-CM | POA: Diagnosis not present

## 2024-04-04 MED ORDER — TRANEXAMIC ACID 650 MG PO TABS: 1300.0000 mg | ORAL_TABLET | Freq: Three times a day (TID) | ORAL | 1 refills | Status: AC

## 2024-04-04 NOTE — Progress Notes (Signed)
 Obstetrics and Gynecology Established Patient Evaluation  Appointment Date: 04/04/2024  [facility]: [facility]  Primary Care Provider: Felicita [facility]  Referring Provider: Felicita Horns, [facility]  Chief Complaint:  Chief Complaint  Patient presents with   Follow-up  Heavy and painful periods  History of Present Illness: Ashlee Campbell is a 43 y.o.  [phone] (Patient's last menstrual period was 03/29/2024.), seen for the above chief complaint. Her past medical history is significant for h/o c/s x 3 with BTL for the last one, BMI 40s  Patient with s/s for years but worsened in the past 1-2 years. Hasn't tried anything for period control except she does do NSAIDs. Has not been on any hormonal birth control.    Patient referred by PCP, who set up referral, and had negative labs and u/s in January (see below)  Patient seen by me on 2/18 and options d/w her and she elected for cyclic Lysteda   Interval History: she's used the lysteda  for five days each cycle x 3 cycles. Length is still the same at a week and pain the same but it is not as heavy as before.   Review of Systems: Pertinent items are noted in HPI.   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/24/2023   History of iron deficiency anemia 11/24/2023   Painful menstrual periods 11/24/2023   External hemorrhoid 11/13/2020   Obesity, morbid, BMI 40.0-49.9 (HCC) 11/30/2019    Past Medical History:  Past Medical History:  Diagnosis Date   Generalized headaches    History of UTI    Past Surgical History:  Past Surgical History:  Procedure Laterality Date   CESAREAN SECTION  2002   CESAREAN SECTION  2005   CESAREAN SECTION WITH BILATERAL TUBAL LIGATION   2009   DILATION AND CURETTAGE, DIAGNOSTIC / THERAPEUTIC  06/2009   DILATION AND CURETTAGE, DIAGNOSTIC / THERAPEUTIC  04/2009    Past Obstetrical History:  OB History  Gravida Para Term Preterm AB Living  6 1 1  3 3   SAB IAB Ectopic Multiple Live Births  2 1   3     # Outcome Date GA Lbr Len/2nd Weight Sex Type Anes PTL Lv  6 SAB 2010          5 SAB 2010          4 Gravida 2009    M CS-LTranv   LIV  3 Gravida 2005    M CS-LTranv   LIV  2 Term 2002   9 lb 5 oz (4.224 kg) F CS-LTranv   LIV  1 IAB            Past Gynecological History: As per HPI. Periods: qmonth, regular, 7-10 days, heavy and painful History of Pap Smear(s): Yes.   Last pap 2021, which was negative cytology and HPV  Social History:  Social History   Socioeconomic History   Marital status: Married    Spouse name: Ashlee Campbell   Number of children: 3   Years of education: high school   Highest education level: 12th grade  Occupational History   Not on file  Tobacco Use   Smoking status: Never   Smokeless tobacco: Never  Vaping Use   Vaping status: Never Used  Substance and  Sexual Activity   Alcohol use: Yes    Comment: less than once a month   Drug use: Never   Sexual activity: Yes    Partners: Male    Birth control/protection: Surgical  Other Topics Concern   Not on file  Social History Narrative   11/13/20   From: the area   Living: with husband, Ashlee Campbell (2001) - 3 children   Work: [facility]       Family: 3 children -  Proofreader (2002), Ashlee Campbell (2005), Ashlee Campbell (2009)      Enjoys: read, spend time with family      Exercise: not currently   Diet: varied diet      Safety   Seat belts: Yes    Guns: No   Safe in relationships: Yes    Social Drivers of Corporate investment banker Strain: Low Risk  (07/17/2023)   Overall Financial Resource Strain (CARDIA)    Difficulty of Paying Living Expenses: Not very hard  Food Insecurity: Patient Declined (07/17/2023)   Hunger Vital Sign    Worried About  Running Out of Food in the Last Year: Patient declined    Ran Out of Food in the Last Year: Patient declined  Transportation Needs: No Transportation Needs (07/17/2023)   PRAPARE - Administrator, Civil Service (Medical): No    Lack of Transportation (Non-Medical): No  Physical Activity: Unknown (07/17/2023)   Exercise Vital Sign    Days of Exercise per Week: Patient declined    Minutes of Exercise per Session: Not on file  Stress: Stress Concern Present (07/17/2023)   [facility] - Occupational Stress Questionnaire    Feeling of Stress : Rather much  Social Connections: Moderately Isolated (07/17/2023)   Social Connection and Isolation Panel [NHANES]    Frequency of Communication with Friends and Family: More than three times a week    Frequency of Social Gatherings with Friends and Family: Once a week    Attends Religious Services: Never    Database administrator or Organizations: No    Attends Engineer, structural: Not on file    Marital Status: Married  Catering manager Violence: Not on file    Family History:  Family History  Problem Relation Age of Onset   Hypertension Mother    Atrial fibrillation Maternal Grandmother    Lung cancer Maternal Grandfather        smoker   Medications [facility] "[facility]" had no medications administered during this visit. Current Outpatient Medications  Medication Sig Dispense Refill   Cholecalciferol (VITAMIN D -3) 125 MCG (5000 UT) TABS Take 1 tablet by mouth daily.     Cranberry Fruit 465 MG CAPS Take one po every day     levocetirizine (XYZAL ) 5 MG tablet Take 1 tablet (5 mg total) by mouth every evening.     Multiple Vitamin (MULTIVITAMIN) tablet Take 2 tablets by mouth daily.     Probiotic Product (PROBIOTIC PO) Take 2 tablets by mouth daily.     tranexamic acid  (LYSTEDA ) 650 MG TABS tablet Take 2 tablets (1,300 mg total) by mouth 3 (three) times daily. Take during menses for a  maximum of five days 90 tablet 1   No current facility-administered medications for this visit.   Allergies Penicillins and Doxycycline   Physical Exam:  BP (!) 159/94   Pulse (!) 106   Wt 246 lb (111.6 kg)   LMP 03/29/2024   BMI 48.04 kg/m  Body mass index is 48.04 kg/m. General appearance: Well nourished, well developed female in no acute distress.  Respiratory:  Normal respiratory effort Neuro/Psych:  Normal mood and affect.   Laboratory: as per HPI  Radiology: no new   Assessment: patient improving  Plan:  1. Dysmenorrhea (Primary)  2. Menorrhagia with regular cycle Options d/w her and she'd like to stay on the Lysteda  for now. Refill given  3. Transient HTN Patient states she does not have HTN diagnosis. Most recent PCP visit with normal BP but I saw a few in the past that were mildly elevated. Will send note to PCP  Return in about 1 year (around 04/04/2025), or if symptoms worsen or fail to improve.  No future appointments.  Tyler Gallant MD Attending [facility] for [employer] [facility])

## 2024-04-04 NOTE — Progress Notes (Signed)
 RGYN here to follow up on medication management with Lysteda .   Pt states she has noticed much improvement. Cycle is much lighter.

## 2024-05-06 ENCOUNTER — Ambulatory Visit: Admitting: Surgery

## 2024-06-14 ENCOUNTER — Encounter: Payer: Self-pay | Admitting: Family

## 2024-08-29 ENCOUNTER — Ambulatory Visit: Admitting: Family

## 2024-08-29 ENCOUNTER — Encounter: Payer: Self-pay | Admitting: Family

## 2024-08-29 VITALS — BP 132/89 | HR 84 | Temp 98.6°F | Ht 60.0 in | Wt 242.0 lb

## 2024-08-29 DIAGNOSIS — L989 Disorder of the skin and subcutaneous tissue, unspecified: Secondary | ICD-10-CM

## 2024-08-29 MED ORDER — CLOTRIMAZOLE-BETAMETHASONE 1-0.05 % EX CREA: 1.0000 | TOPICAL_CREAM | Freq: Every day | CUTANEOUS | 0 refills | Status: AC

## 2024-08-29 NOTE — Progress Notes (Signed)
 Established Patient Office Visit  Subjective:      CC:  Chief Complaint  Patient presents with   Rash    Started Friday was open blisters but now scabbed over. Was burning now very itchy     HPI: Ashlee [facility] is a 43 y.o. female presenting on 08/29/2024 for Rash (Started Friday was open blisters but now scabbed over. Was burning now very itchy ) .  Discussed the use of AI scribe software for clinical note transcription with the patient, who gave verbal consent to proceed.  History of Present Illness Ashlee [facility] is a 43 year old female who presents with a skin lesion and concerns about shingles.  She noticed a tiny red spot on her skin after a shower approximately three to four days ago. The spot was initially scabbing over but has been disturbed by showering and clothing, causing the scab to come off and then reform. It is described as itchy but not burning or tingling. She has a history of shingles.  She experienced a brief sensation of pins and needles in her leg on the same day she noticed the spot, but this sensation was transient and has not recurred. She has a history of lower back pain since May of the previous year, but denies any radiation of pain or sciatica symptoms.  No flu-like symptoms or fever. She was recently battling sinus issues and was taking Sudafed, but these symptoms have resolved.  She is concerned about her blood pressure, noting it was elevated during a gynecologist visit in January, but she has not observed any issues when checking it at home.         Social history:  Relevant past medical, surgical, family and social history reviewed and updated as indicated. Interim medical history since our last visit reviewed.  Allergies and medications reviewed and updated.  DATA REVIEWED: CHART IN EPIC     ROS: Negative unless specifically indicated above in HPI.    Current Outpatient Medications:    Cholecalciferol  (VITAMIN D -3) 125 MCG (5000 UT) TABS, Take 1 tablet by mouth daily., Disp: , Rfl:    clotrimazole-betamethasone (LOTRISONE) cream, Apply 1 Application topically daily., Disp: 30 g, Rfl: 0   Cranberry Fruit 465 MG CAPS, Take one po every day, Disp: , Rfl:    levocetirizine (XYZAL ) 5 MG tablet, Take 1 tablet (5 mg total) by mouth every evening., Disp: , Rfl:    Multiple Vitamin (MULTIVITAMIN) tablet, Take 2 tablets by mouth daily., Disp: , Rfl:    Probiotic Product (PROBIOTIC PO), Take 2 tablets by mouth daily., Disp: , Rfl:    tirzepatide (ZEPBOUND) 2.5 MG/0.5ML injection vial, Inject into the skin., Disp: , Rfl:    tranexamic acid  (LYSTEDA ) 650 MG TABS tablet, Take 2 tablets (1,300 mg total) by mouth 3 (three) times daily. Take during menses for a maximum of five days, Disp: 90 tablet, Rfl: 1        Objective:        BP 132/89   Pulse 84   Temp 98.6 F (37 C) (Temporal)   Ht 5' (1.524 m)   Wt 242 lb (109.8 kg)   SpO2 97%   BMI 47.26 kg/m   Physical Exam VITALS: BP- 132/89 SKIN: Skin lesion with slight thickening on palpation and irritation, non-painful.  Wt Readings from Last 3 Encounters:  08/29/24 242 lb (109.8 kg)  04/04/24 246 lb (111.6 kg)  01/05/24 243 lb 6.4 oz (110.4 kg)  Physical Exam Skin:    Comments: Left upper medial back with small healing scabbed over lesion without erythema no drainage.   Slightly inferior to the left of lesion is a annual erythematic lesion with central clearing with some scaly texture              Results   Assessment & Plan:   Assessment and Plan Assessment & Plan Localized pruritic skin lesion, possible contact dermatitis or tinea The lesion is small, red, and pruritic, with a slight annular appearance, suggesting tinea (ringworm) or contact dermatitis. Absence of burning or tingling reduces the likelihood of shingles. No evidence of infection is present, and the lesion is improving without significant pain. -  Prescribe a combination cream with steroid and antifungal to apply twice daily to the affected area. - Monitor for changes. Reassess if the lesion grows or symptoms suggest shingles. - Reassure that the lesion does not appear to be shingles due to the absence of burning or tingling. - Discuss ineligibility for the shingles vaccine until age 2 unless specific conditions such as immunosuppression are present.       Return if symptoms worsen or fail to improve.     Ginger Patrick, MSN, APRN, FNP-C [facility]

## 2024-09-21 ENCOUNTER — Other Ambulatory Visit: Payer: Self-pay | Admitting: Obstetrics and Gynecology
